# Patient Record
Sex: Female | Born: 1975 | Race: White | Hispanic: No | Marital: Married | Smoking: Current every day smoker
Health system: Southern US, Community
[De-identification: ages and names within clinical notes are randomized; demographics above are authoritative.]

## PROBLEM LIST (undated history)

## (undated) ENCOUNTER — Ambulatory Visit: Admission: EM | Payer: MEDICAID | Source: Home / Self Care

## (undated) DIAGNOSIS — D682 Hereditary deficiency of other clotting factors: Secondary | ICD-10-CM

## (undated) DIAGNOSIS — I10 Essential (primary) hypertension: Secondary | ICD-10-CM

## (undated) HISTORY — PX: LEEP: SHX91

## (undated) HISTORY — PX: OTHER SURGICAL HISTORY: SHX169

---

## 2002-05-12 ENCOUNTER — Encounter: Payer: Self-pay | Admitting: Emergency Medicine

## 2002-05-12 ENCOUNTER — Emergency Department (HOSPITAL_COMMUNITY): Admission: EM | Admit: 2002-05-12 | Discharge: 2002-05-13 | Payer: Self-pay | Admitting: Emergency Medicine

## 2002-05-13 ENCOUNTER — Encounter: Payer: Self-pay | Admitting: Emergency Medicine

## 2004-07-17 ENCOUNTER — Emergency Department: Payer: Self-pay | Admitting: General Practice

## 2005-03-02 ENCOUNTER — Emergency Department: Payer: Self-pay | Admitting: Emergency Medicine

## 2005-03-03 ENCOUNTER — Ambulatory Visit: Payer: Self-pay | Admitting: Emergency Medicine

## 2005-09-30 ENCOUNTER — Emergency Department: Payer: Self-pay | Admitting: Emergency Medicine

## 2005-10-05 ENCOUNTER — Emergency Department: Payer: Self-pay | Admitting: Emergency Medicine

## 2006-03-08 ENCOUNTER — Emergency Department: Payer: Self-pay | Admitting: Unknown Physician Specialty

## 2006-09-26 ENCOUNTER — Emergency Department: Payer: Self-pay | Admitting: Unknown Physician Specialty

## 2006-09-28 ENCOUNTER — Emergency Department: Payer: Self-pay | Admitting: Emergency Medicine

## 2006-12-02 ENCOUNTER — Ambulatory Visit: Payer: Self-pay | Admitting: Gastroenterology

## 2006-12-09 ENCOUNTER — Emergency Department: Payer: Self-pay | Admitting: Emergency Medicine

## 2007-08-28 ENCOUNTER — Emergency Department: Payer: Self-pay | Admitting: Emergency Medicine

## 2007-10-17 ENCOUNTER — Emergency Department: Payer: Self-pay

## 2008-04-01 ENCOUNTER — Emergency Department: Payer: Self-pay | Admitting: Emergency Medicine

## 2008-04-05 ENCOUNTER — Emergency Department: Payer: Self-pay | Admitting: Emergency Medicine

## 2011-01-01 ENCOUNTER — Emergency Department: Payer: Self-pay | Admitting: Emergency Medicine

## 2020-03-17 ENCOUNTER — Emergency Department: Payer: Self-pay

## 2020-03-17 ENCOUNTER — Other Ambulatory Visit: Payer: Self-pay

## 2020-03-17 ENCOUNTER — Observation Stay: Payer: Self-pay

## 2020-03-17 ENCOUNTER — Observation Stay
Admission: EM | Admit: 2020-03-17 | Discharge: 2020-03-18 | Disposition: A | Discharge status: Home / Self Care | Payer: Self-pay | Attending: Family Medicine | Admitting: Family Medicine

## 2020-03-17 DIAGNOSIS — R200 Anesthesia of skin: Secondary | ICD-10-CM

## 2020-03-17 DIAGNOSIS — I639 Cerebral infarction, unspecified: Secondary | ICD-10-CM

## 2020-03-17 DIAGNOSIS — J449 Chronic obstructive pulmonary disease, unspecified: Secondary | ICD-10-CM

## 2020-03-17 DIAGNOSIS — G459 Transient cerebral ischemic attack, unspecified: Principal | ICD-10-CM | POA: Insufficient documentation

## 2020-03-17 DIAGNOSIS — I10 Essential (primary) hypertension: Secondary | ICD-10-CM | POA: Insufficient documentation

## 2020-03-17 DIAGNOSIS — Z20822 Contact with and (suspected) exposure to covid-19: Secondary | ICD-10-CM | POA: Insufficient documentation

## 2020-03-17 DIAGNOSIS — F17200 Nicotine dependence, unspecified, uncomplicated: Secondary | ICD-10-CM | POA: Insufficient documentation

## 2020-03-17 DIAGNOSIS — R2990 Unspecified symptoms and signs involving the nervous system: Secondary | ICD-10-CM

## 2020-03-17 HISTORY — DX: Hereditary deficiency of other clotting factors: D68.2

## 2020-03-17 HISTORY — DX: Essential (primary) hypertension: I10

## 2020-03-17 LAB — DIFFERENTIAL
Abs Immature Granulocytes: 0.02 10*3/uL (ref 0.00–0.07)
Basophils Absolute: 0 10*3/uL (ref 0.0–0.1)
Basophils Relative: 1 %
Eosinophils Absolute: 0.3 10*3/uL (ref 0.0–0.5)
Eosinophils Relative: 5 %
Immature Granulocytes: 0 %
Lymphocytes Relative: 28 %
Lymphs Abs: 1.5 10*3/uL (ref 0.7–4.0)
Monocytes Absolute: 0.5 10*3/uL (ref 0.1–1.0)
Monocytes Relative: 9 %
Neutro Abs: 3 10*3/uL (ref 1.7–7.7)
Neutrophils Relative %: 57 %

## 2020-03-17 LAB — URINE DRUG SCREEN, QUALITATIVE (ARMC ONLY)
Amphetamines, Ur Screen: NOT DETECTED
Barbiturates, Ur Screen: NOT DETECTED
Benzodiazepine, Ur Scrn: NOT DETECTED
Cannabinoid 50 Ng, Ur ~~LOC~~: NOT DETECTED
Cocaine Metabolite,Ur ~~LOC~~: NOT DETECTED
MDMA (Ecstasy)Ur Screen: NOT DETECTED
Methadone Scn, Ur: NOT DETECTED
Opiate, Ur Screen: NOT DETECTED
Phencyclidine (PCP) Ur S: NOT DETECTED
Tricyclic, Ur Screen: NOT DETECTED

## 2020-03-17 LAB — COMPREHENSIVE METABOLIC PANEL
ALT: 11 U/L (ref 0–44)
AST: 12 U/L — ABNORMAL LOW (ref 15–41)
Albumin: 4.2 g/dL (ref 3.5–5.0)
Alkaline Phosphatase: 50 U/L (ref 38–126)
Anion gap: 10 (ref 5–15)
BUN: 13 mg/dL (ref 6–20)
CO2: 25 mmol/L (ref 22–32)
Calcium: 8.7 mg/dL — ABNORMAL LOW (ref 8.9–10.3)
Chloride: 103 mmol/L (ref 98–111)
Creatinine, Ser: 0.75 mg/dL (ref 0.44–1.00)
GFR, Estimated: >60 mL/min (ref >60)
Glucose, Bld: 116 mg/dL — ABNORMAL HIGH (ref 70–99)
Potassium: 3.8 mmol/L (ref 3.5–5.1)
Sodium: 138 mmol/L (ref 135–145)
Total Bilirubin: 0.4 mg/dL (ref 0.3–1.2)
Total Protein: 7.2 g/dL (ref 6.5–8.1)

## 2020-03-17 LAB — ETHANOL: Alcohol, Ethyl (B): <10 mg/dL (ref <10)

## 2020-03-17 LAB — CBC
HCT: 37.2 % (ref 36.0–46.0)
Hemoglobin: 12.4 g/dL (ref 12.0–15.0)
MCH: 29.9 pg (ref 26.0–34.0)
MCHC: 33.3 g/dL (ref 30.0–36.0)
MCV: 89.6 fL (ref 80.0–100.0)
Platelets: 219 10*3/uL (ref 150–400)
RBC: 4.15 MIL/uL (ref 3.87–5.11)
RDW: 12.6 % (ref 11.5–15.5)
WBC: 5.2 10*3/uL (ref 4.0–10.5)
nRBC: 0 % (ref 0.0–0.2)

## 2020-03-17 LAB — URINALYSIS, ROUTINE W REFLEX MICROSCOPIC
Bilirubin Urine: NEGATIVE
Glucose, UA: NEGATIVE mg/dL
Hgb urine dipstick: NEGATIVE
Ketones, ur: NEGATIVE mg/dL
Leukocytes,Ua: NEGATIVE
Nitrite: NEGATIVE
Protein, ur: NEGATIVE mg/dL
Specific Gravity, Urine: 1.005 (ref 1.005–1.030)
pH: 8 (ref 5.0–8.0)

## 2020-03-17 LAB — PROTIME-INR
INR: 0.9 (ref 0.8–1.2)
Prothrombin Time: 12 seconds (ref 11.4–15.2)

## 2020-03-17 LAB — RESP PANEL BY RT-PCR (FLU A&B, COVID) ARPGX2
Influenza A by PCR: NEGATIVE
Influenza B by PCR: NEGATIVE
SARS Coronavirus 2 by RT PCR: NEGATIVE

## 2020-03-17 LAB — APTT: aPTT: 28 seconds (ref 24–36)

## 2020-03-17 LAB — POC URINE PREG, ED: Preg Test, Ur: NEGATIVE

## 2020-03-17 LAB — CBG MONITORING, ED: Glucose-Capillary: 123 mg/dL — ABNORMAL HIGH (ref 70–99)

## 2020-03-17 MED ORDER — IOHEXOL 350 MG/ML SOLN
75.0000 mL | Freq: Once | INTRAVENOUS | Status: AC | PRN
  Administered 2020-03-17: 75 mL via INTRAVENOUS

## 2020-03-17 MED ORDER — ACETAMINOPHEN 325 MG PO TABS
650.0000 mg | ORAL_TABLET | ORAL | Status: DC | PRN
  Administered 2020-03-17 – 2020-03-18 (×2): 650 mg via ORAL
  Filled 2020-03-17 (×2): qty 2

## 2020-03-17 MED ORDER — ASPIRIN 81 MG PO CHEW
81.0000 mg | CHEWABLE_TABLET | Freq: Every day | ORAL | Status: DC
  Administered 2020-03-17 – 2020-03-18 (×2): 81 mg via ORAL
  Filled 2020-03-17 (×2): qty 1

## 2020-03-17 MED ORDER — ATORVASTATIN CALCIUM 20 MG PO TABS
40.0000 mg | ORAL_TABLET | Freq: Every day | ORAL | Status: DC
  Administered 2020-03-17 – 2020-03-18 (×2): 40 mg via ORAL
  Filled 2020-03-17 (×2): qty 2

## 2020-03-17 MED ORDER — STROKE: EARLY STAGES OF RECOVERY BOOK: Freq: Once | Status: DC

## 2020-03-17 MED ORDER — ENOXAPARIN SODIUM 40 MG/0.4ML ~~LOC~~ SOLN
40.0000 mg | SUBCUTANEOUS | Status: DC
  Administered 2020-03-17: 40 mg via SUBCUTANEOUS
  Filled 2020-03-17: qty 0.4

## 2020-03-17 MED ORDER — ACETAMINOPHEN 160 MG/5ML PO SOLN
650.0000 mg | ORAL | Status: DC | PRN
  Filled 2020-03-17: qty 20.3

## 2020-03-17 MED ORDER — ENOXAPARIN SODIUM 60 MG/0.6ML ~~LOC~~ SOLN
0.5000 mg/kg | SUBCUTANEOUS | Status: DC
  Filled 2020-03-17: qty 0.6

## 2020-03-17 MED ORDER — ACETAMINOPHEN 650 MG RE SUPP: 650.0000 mg | RECTAL | Status: DC | PRN

## 2020-03-17 MED ORDER — ALBUTEROL SULFATE HFA 108 (90 BASE) MCG/ACT IN AERS
2.0000 | INHALATION_SPRAY | RESPIRATORY_TRACT | Status: DC | PRN
  Filled 2020-03-17: qty 6.7

## 2020-03-17 NOTE — ED Provider Notes (Signed)
[facility] Emergency Department Provider Note  ____________________________________________   First MD Initiated Contact with Patient 03/17/20 1106     (approximate)  I have reviewed the triage vital signs and the nursing notes.   HISTORY  Chief Complaint Numbness    HPI Cindy Manning is a 44 y.o. female  With ho HTN here with R sided numbness, weakness. Pt states that approx 20 min ago, she was brushing her teeth when she began to feel R leg and R arm numbness. She also felt like her R face and teeth were numb. She then noticed she has been having some difficulty keeping her arm up to brush her teeth, and her legs "buckled" when walking. No vision changes. No l sided changes. Sx have persisted since then but slightly improved. No ongoing weakness. No headache. She does admit to significant increased stress recently, as her son is in the ICU.         Past Medical History:  Diagnosis Date  . Hypertension     There are no problems to display for this patient.   History reviewed. No pertinent surgical history.  Prior to Admission medications   Not on File    Allergies Patient has no known allergies.  History reviewed. No pertinent family history.  Social History Social History   Tobacco Use  . Smoking status: Current Every Day Smoker  . Smokeless tobacco: Never Used  Substance Use Topics  . Alcohol use: Not on file  . Drug use: Not on file    Review of Systems  Review of Systems  Constitutional: Negative for chills and fever.  HENT: Negative for sore throat.   Respiratory: Negative for shortness of breath.   Cardiovascular: Negative for chest pain.  Gastrointestinal: Negative for abdominal pain.  Genitourinary: Negative for flank pain.  Musculoskeletal: Negative for neck pain.  Skin: Negative for rash and wound.  Allergic/Immunologic: Negative for immunocompromised state.  Neurological: Positive for weakness and numbness.   Hematological: Does not bruise/bleed easily.     ____________________________________________  PHYSICAL EXAM:      VITAL SIGNS: ED Triage Vitals [03/17/20 1108]  Enc Vitals Group     BP (!) 264/139     Pulse Rate (!) 109     Resp 14     Temp 97.7 F (36.5 C)     Temp Source Oral     SpO2 100 %     Weight      Height      Head Circumference      Peak Flow      Pain Score 7     Pain Loc      Pain Edu?      Excl. in GC?      Physical Exam Vitals and nursing note reviewed.  Constitutional:      General: She is not in acute distress.    Appearance: She is well-developed.  HENT:     Head: Normocephalic and atraumatic.  Eyes:     Conjunctiva/sclera: Conjunctivae normal.  Cardiovascular:     Rate and Rhythm: Normal rate and regular rhythm.     Heart sounds: Normal heart sounds.  Pulmonary:     Effort: Pulmonary effort is normal. No respiratory distress.     Breath sounds: No wheezing.  Abdominal:     General: There is no distension.  Musculoskeletal:     Cervical back: Neck supple.  Skin:    General: Skin is warm.  Capillary Refill: Capillary refill takes less than 2 seconds.     Findings: No rash.  Neurological:     Mental Status: She is alert and oriented to person, place, and time.     Motor: No abnormal muscle tone.      Neurological Exam:  Mental Status: Alert and oriented to person, place, and time. Attention and concentration normal. Speech clear. Recent memory is intact. Cranial Nerves: Visual fields grossly intact. EOMI and PERRLA. No nystagmus noted. Facial sensation intact at forehead, maxillary cheek, and chin/mandible on left, diminished on right. No facial asymmetry or weakness. Hearing grossly normal. Uvula is midline, and palate elevates symmetrically. Normal SCM and trapezius strength. Tongue midline without fasciculations. Motor: Muscle strength 5/5 in proximal and distal UE and LE bilaterally. No pronator drift. Muscle tone normal.   Sensation: subjectively diminished R face, RUE and RLE Gait: Deferred Coordination: Normal FTN bilaterally.    ____________________________________________   LABS (all labs ordered are listed, but only abnormal results are displayed)  Labs Reviewed  CBG MONITORING, ED - Abnormal; Notable for the following components:      Result Value   Glucose-Capillary 123 (*)    All other components within normal limits  ETHANOL  PROTIME-INR  APTT  CBC  DIFFERENTIAL  COMPREHENSIVE METABOLIC PANEL  URINE DRUG SCREEN, QUALITATIVE ([facility] ONLY)  URINALYSIS, ROUTINE W REFLEX MICROSCOPIC  POC URINE PREG, ED  I-STAT CREATININE, ED    ____________________________________________  EKG: Normal sinus rhythm, ventricular rate 95.  QRS 96, QTc 473.  No acute ST elevations or depression but no EKG evidence of acute ischemia or infarct. ________________________________________  RADIOLOGY All imaging, including plain films, CT scans, and ultrasounds, independently reviewed by me, and interpretations confirmed via formal radiology reads.  ED MD interpretation:   CT Head: NAICA  Official radiology report(s): CT HEAD CODE STROKE WO CONTRAST  Addendum Date: 03/17/2020   ADDENDUM REPORT: 03/17/2020 11:59 ADDENDUM: Code stroke imaging results were communicated on 03/17/2020 at 11:50 am to provider Dr. Erma Heritage Via telephone, who verbally acknowledged these results. Electronically Signed   By: Stana Bunting M.D.   On: 03/17/2020 11:59   Result Date: 03/17/2020 CLINICAL DATA:  Code stroke.  Neuro deficit, acute, stroke suspected EXAM: CT HEAD WITHOUT CONTRAST TECHNIQUE: Contiguous axial images were obtained from the base of the skull through the vertex without intravenous contrast. COMPARISON:  None. FINDINGS: Brain: No acute infarct or intracranial hemorrhage. No mass lesion. No midline shift, ventriculomegaly or extra-axial fluid collection. Vascular: No hyperdense vessel or unexpected calcification.  Skull: Negative for fracture or focal lesion. Sinuses/Orbits: No acute finding. Clear paranasal sinuses and mastoid air cells. Other: None. ASPECTS [facility] Stroke Program Early CT Score) - Ganglionic level infarction (caudate, lentiform nuclei, internal capsule, insula, M1-M3 cortex): 7 - Supraganglionic infarction (M4-M6 cortex): 3 Total score (0-10 with 10 being normal): 10 IMPRESSION: 1. No acute intracranial process. If suspicion persists consider MRI brain for further evaluation. 2. ASPECTS is 10. Electronically Signed: By: Stana Bunting M.D. On: 03/17/2020 11:44    ____________________________________________  PROCEDURES   Procedure(s) performed (including Critical Care):  Procedures  ____________________________________________  INITIAL IMPRESSION / MDM / ASSESSMENT AND PLAN / ED COURSE  As part of my medical decision making, I reviewed the following data within the electronic MEDICAL RECORD NUMBER Nursing notes reviewed and incorporated, Old chart reviewed, Notes from prior ED visits, and [place] Controlled Substance Database       *Cindy Manning was evaluated in Emergency Department on 03/17/2020  for the symptoms described in the history of present illness. She was evaluated in the context of the global COVID-19 pandemic, which necessitated consideration that the patient might be at risk for infection with the SARS-CoV-2 virus that causes COVID-19. Institutional protocols and algorithms that pertain to the evaluation of patients at risk for COVID-19 are in a state of rapid change based on information released by regulatory bodies including the CDC and federal and state organizations. These policies and algorithms were followed during the patient's care in the ED.  Some ED evaluations and interventions may be delayed as a result of limited staffing during the pandemic.*     Medical Decision Making:  44 yo F here with acute onset R-sided numbness, possible weakness vs loss of  coordination. Suspect lacunar infarct. LKN 30 min PTA so code stroke activated, and Dr. Loretha Brasil present at bedside for evaluation. CT head reviewed, shows NAICA. Suspect lacunar infarct. NIHSS <2. Do not feel benefits of tPA outweigh risks, and her deficits seem to be resolving. Will admit for stroke w/u. Will allow permissive HTN for now. Pt updated and in agreement. Maintained on tele in ED which was reviewed, shows NSR without AFib or other arrhythmia.  ____________________________________________  FINAL CLINICAL IMPRESSION(S) / ED DIAGNOSES  Final diagnoses:  Stroke-like episode     MEDICATIONS GIVEN DURING THIS VISIT:  Medications  iohexol (OMNIPAQUE) 350 MG/ML injection 75 mL (75 mLs Intravenous Contrast Given 03/17/20 1144)     ED Discharge Orders    None       Note:  This document was prepared using Dragon voice recognition software and may include unintentional dictation errors.   Shaune Pollack, MD 03/17/20 [phone]

## 2020-03-17 NOTE — ED Triage Notes (Signed)
Pt presents via POV c/o right sided numbness x20 mins. Denies aphasia, dysphagia, and weakness. Pt reports under a lot of stress currently. A&Ox4.

## 2020-03-17 NOTE — ED Notes (Signed)
Patient from MRI to US

## 2020-03-17 NOTE — ED Notes (Signed)
Neurologist at CT bedside.

## 2020-03-17 NOTE — H&P (Signed)
History and Physical  CAMBRYN CHARTERS ZOX:[id number] DOB: Aug 11, 1975 DOA: 03/17/2020  PCP: Patient, No Pcp Per   Chief Complaint: right side numbness  HPI:  44 year old woman PMH essential hypertension untreated, not on any medications, COPD, untreated, presented to the emergency department after developing sudden right-sided upper and lower extremity and facial numbness.  Admitted for TIA versus CVA evaluation.  Patient was folding laundry today when at about 11 AM she noticed that her right leg was numb.  She thought this might have been positional and went to brush her teeth, however had difficulty brushing her teeth with her right hand secondary to numbness as well.  Had difficulty walking which she thinks was sensation related with some knee buckling.  Also noted right-sided facial numbness and numbness in her mouth.  No specific aggravating or alleviating factors.  The numbness in her extremities appears to slightly improved only to progress.  No motor deficits noted.  No difficulty speaking or swallowing.  No previous history of similar symptoms.  Reports longstanding hypertension with baseline blood pressure around 180/110, untreated.  In the past she was on hydrochlorothiazide and lisinopril but reports that her blood pressure ran high despite this.  Under a lot of stress, her son is currently hospitalized.  ED Course: Seen by neurology with recommendation for stroke evaluation.  Review of Systems:  Negative for fever, visual changes, sore throat, rash, new muscle aches, chest pain, shortness of breath, dysuria, bleeding, nausea, vomiting, abdominal pain.  PMH . Essential hypertension . COPD . 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 Remainder reviewed in Epic  PSH . C-section . Knee arthroscopy . Remainder reviewed in Epic  Family history includes: . Sister with factor V Leiden deficiency and factor VII deficiency . Remainder reviewed in Epic  Social  History . Smokes 1.5 packs/day . No alcohol . Quit narcotics more than 3 days ago  Allergies . Biaxin caused a rash . Latex caused skin irritation . Remainder reviewed in Epic  Meds include: . None   Physicial Exam   Vitals:  Vitals:   03/17/20 1230 03/17/20 1315  BP: (!) 184/110 (!) 127/115  Pulse: 85 83  Resp: 14 18  Temp:    SpO2: 98% 97%   Constitutional:   . Appears calm and comfortable in the emergency department Eyes:  . pupils and irises appear normal . Normal lids  ENMT:  . grossly normal hearing  . Lips appear normal . nose appear normal . Oropharynx: mucosa, tongue appear normal.  The left side of the soft palate may be slightly depressed. Neck:  . neck appears normal, no masses . no thyromegaly Respiratory:  . CTA bilaterally, no w/r/r.  . Respiratory effort normal.  Cardiovascular:  . RRR, no m/r/g . No LE extremity edema   .  Abdomen:  . Abdomen appears normal; no tenderness or masses . No hernias Musculoskeletal:  . RUE, LUE, RLE, LLE   o Tone appears grossly normal all extremities.  Perhaps slightly less strength in the right upper and right lower extremities, 4+/5 although may be intact. o No tenderness, masses Skin:  . No rashes, lesions, ulcers . palpation of skin: no induration or nodules Neurologic:  . CN 2-12 intact . Sensation all 4 extremities grossly intact.  Reports diminished sensation on the right side. Psychiatric:  . Mental status o Mood, affect appropriate . judgment and insight appear intact   I have personally reviewed following labs and imaging studies  Labs:  Marland Kitchen Complete  metabolic panel unremarkable . CBC unremarkable . Urine pregnancy test negative . Urine drug screen negative  Imaging studies:   CT head neck no acute abnormalities.  No large vessel occlusion.  Medical tests:   EKG independently reviewed: Sinus rhythm, no acute changes  ASSESSMENT/PLAN   Right upper and right lower extremity, right  facial numbness, possibly some RUE/RLE weakness (mild), suspicious for CVA versus TIA.  Not a candidate for TPA as per neurology. --CTA head and neck unrevealing --Appreciate neurology consultation.  We will plan stroke evaluation to include MRI brain, echocardiogram and therapy evaluations. --Aspirin 81 mg daily.  Start statin.  Fasting lipid panel in the morning. --Permissive hypertension as below  Essential hypertension uncontrolled --Known diagnosis but patient has been off medications for some time --Reports home BP typically runs 180/110, blood pressure here running about the same.  Treat with permissive hypertension.  As blood pressures at baseline, will not aggressively reduce.  Will treat for greater than 200/120 but will monitor closely.  COPD --Appears stable.  Albuterol nebulizer as needed.  Covid screening protocol pending.  No signs or symptoms to suggest infection.  DVT prophylaxis: enoxaparin Code Status: Full Family Communication: husband at bedside Consults called: neurology by EDP    Time spent: 50 minutes  Brendia Sacks, MD  [facility] Direct contact: see [email or web address]  7PM-7AM contact night coverage as below   1. Check the care team in [facility] and look for a) attending/consulting [facility] provider listed and b) the [facility] team listed 2. Log into [email or web address] and use [place]'s universal password to access. If you do not have the password, please contact the hospital operator. 3. Locate the [facility] provider you are looking for under [facility] and page to a number that you can be directly reached. 4. If you still have difficulty reaching the provider, please page the [facility] (Director on Call) for the Hospitalists listed on amion for assistance.  Severity of Illness: The appropriate patient status for this patient is OBSERVATION. Observation status is judged to be reasonable and necessary in order to provide the required intensity of service to ensure the  patient's safety. The patient's presenting symptoms, physical exam findings, and initial radiographic and laboratory data in the context of their medical condition is felt to place them at decreased risk for further clinical deterioration. Furthermore, it is anticipated that the patient will be medically stable for discharge from the hospital within 2 midnights of admission. The following factors support the patient status of observation.   " The patient's presenting symptoms include right facial, arm and leg numbness. " The physical exam findings include possibly mild right-sided upper and lower extremity weakness, hypertension SBP 180s, diastolic blood pressure 110s. " The initial radiographic and laboratory data are reassuring.  Status is: Observation  The patient remains OBS appropriate and will d/c before 2 midnights.  Dispo: The patient is from: Home              Anticipated d/c is to: Home              Anticipated d/c date is: 1 day              Patient currently is not medically stable to d/c.   03/17/2020, 1:59 PM   Principal Problem:   TIA (transient ischemic attack) Active Problems:   Right leg numbness   Right arm numbness   Benign essential HTN   COPD (chronic obstructive pulmonary disease) (HCC)

## 2020-03-17 NOTE — ED Notes (Signed)
Patient back from scans.

## 2020-03-17 NOTE — Consult Note (Signed)
Reason for Consult: R sided numbness  Requesting Physician: Dr. Erma Heritage  CC: R sided numbness    HPI: Cindy Manning is an 44 y.o. female with hx of uncontrolled HTN, not other past medical problems per patient on no medications. Last known well around 9 AM  After she woke up and started to fold laundry. Started feeling numbness RLE progressed to whole R side.  NIHSS of 1 on exam.   Past Medical History:  Diagnosis Date  . Hypertension     History reviewed. No pertinent surgical history.  History reviewed. No pertinent family history.  Social History:  reports that she has been smoking. She has never used smokeless tobacco. No history on file for alcohol use and drug use.  No Known Allergies  Medications: I have reviewed the patient's current medications.  ROS: History obtained from the patient  General ROS: negative for - chills, fatigue, fever, night sweats, weight gain or weight loss Psychological ROS: negative for - behavioral disorder, hallucinations, memory difficulties, mood swings or suicidal ideation Ophthalmic ROS: negative for - blurry vision, double vision, eye pain or loss of vision ENT ROS: negative for - epistaxis, nasal discharge, oral lesions, sore throat, tinnitus or vertigo Allergy and Immunology ROS: negative for - hives or itchy/watery eyes Hematological and Lymphatic ROS: negative for - bleeding problems, bruising or swollen lymph nodes Endocrine ROS: negative for - galactorrhea, hair pattern changes, polydipsia/polyuria or temperature intolerance Respiratory ROS: negative for - cough, hemoptysis, shortness of breath or wheezing Cardiovascular ROS: negative for - chest pain, dyspnea on exertion, edema or irregular heartbeat Gastrointestinal ROS: negative for - abdominal pain, diarrhea, hematemesis, nausea/vomiting or stool incontinence Genito-Urinary ROS: negative for - dysuria, hematuria, incontinence or urinary frequency/urgency Musculoskeletal ROS:  negative for - joint swelling or muscular weakness Neurological ROS: as noted in HPI Dermatological ROS: negative for rash and skin lesion changes  Physical Examination: Blood pressure (!) 264/139, pulse (!) 109, temperature 97.7 F (36.5 C), temperature source Oral, resp. rate 14, last menstrual period 02/10/2020, SpO2 100 %.   Neurological Examination   Mental Status: Alert, oriented, thought content appropriate.  Speech fluent without evidence of aphasia.  Able to follow 3 step commands without difficulty. Cranial Nerves: II: Discs flat bilaterally; Visual fields grossly normal, pupils equal, round, reactive to light and accommodation III,IV, VI: ptosis not present, extra-ocular motions intact bilaterally V,VII: smile symmetric, facial light touch sensation normal bilaterally VIII: hearing normal bilaterally IX,X: gag reflex present XI: bilateral shoulder shrug XII: midline tongue extension Motor: Right : Upper extremity   5/5    Left:     Upper extremity   5/5  Lower extremity   5/5     Lower extremity   5/5 Tone and bulk:normal tone throughout; no atrophy noted Sensory: subjective Numbness R side Deep Tendon Reflexes: 2+ and symmetric throughout     Laboratory Studies:   Basic Metabolic Panel: No results for input(s): NA, K, CL, CO2, GLUCOSE, BUN, CREATININE, CALCIUM, MG, PHOS in the last 168 hours.  Liver Function Tests: No results for input(s): AST, ALT, ALKPHOS, BILITOT, PROT, ALBUMIN in the last 168 hours. No results for input(s): LIPASE, AMYLASE in the last 168 hours. No results for input(s): AMMONIA in the last 168 hours.  CBC: No results for input(s): WBC, NEUTROABS, HGB, HCT, MCV, PLT in the last 168 hours.  Cardiac Enzymes: No results for input(s): CKTOTAL, CKMB, CKMBINDEX, TROPONINI in the last 168 hours.  BNP: Invalid input(s): POCBNP  CBG: Recent Labs  Lab 03/17/20 1114  GLUCAP 123*    Microbiology: No results found for this or any previous  visit.  Coagulation Studies: No results for input(s): LABPROT, INR in the last 72 hours.  Urinalysis: No results for input(s): COLORURINE, LABSPEC, PHURINE, GLUCOSEU, HGBUR, BILIRUBINUR, KETONESUR, PROTEINUR, UROBILINOGEN, NITRITE, LEUKOCYTESUR in the last 168 hours.  Invalid input(s): APPERANCEUR  Lipid Panel:  No results found for: CHOL, TRIG, HDL, CHOLHDL, VLDL, LDLCALC  HgbA1C: No results found for: HGBA1C  Urine Drug Screen:  No results found for: LABOPIA, COCAINSCRNUR, LABBENZ, AMPHETMU, THCU, LABBARB  Alcohol Level: No results for input(s): ETH in the last 168 hours.  Other results: EKG: normal EKG, normal sinus rhythm, unchanged from previous tracings.  Imaging: No results found.   Assessment/Plan:  44 y.o. female with hx of uncontrolled HTN, not other past medical problems per patient on no medications. Last known well around 9 AM  After she woke up and started to fold laundry. Started feeling numbness RLE progressed to whole R side.  NIHSS of 1 on exam.   - HTN is a risk factor for stroke - CTH no acute abnormalities - Not tpa candidate as low NIHSS of 1 for sensory deficits - CTA h/n ordered - ASA 81mg /statin to be started - pt/ot/speech - 2decho - MRI brain.  d/c planning tomorrow with better BP control. Would allow permissive HTN overnight and not treat unless >180 sustained.  03/17/2020, 11:44 AM

## 2020-03-17 NOTE — ED Notes (Signed)
Patient to CT.

## 2020-03-17 NOTE — ED Notes (Signed)
Patient at x-ray/MRI.

## 2020-03-17 NOTE — ED Notes (Signed)
Back back from CT at this time.

## 2020-03-17 NOTE — ED Notes (Signed)
CODE  STROKE  CALLED  TO  [phone]  PER  DR  ISSACS MD

## 2020-03-17 NOTE — Progress Notes (Signed)
PHARMACIST - PHYSICIAN COMMUNICATION  CONCERNING:  Enoxaparin (Lovenox) for DVT Prophylaxis    RECOMMENDATION: Patient was prescribed enoxaprin 40mg  q24 hours for VTE prophylaxis.   Filed Weights   03/17/20 1358  Weight: 86.2 kg (190 lb)    Body mass index is 33.66 kg/m.  Estimated Creatinine Clearance: 93.4 mL/min (by C-G formula based on SCr of 0.75 mg/dL).   Based on [facility] policy patient is candidate for enoxaparin 0.5mg /kg TBW SQ every 24 hours based on BMI being >30.  DESCRIPTION: Pharmacy has adjusted enoxaparin dose per [facility] policy.  Patient is now receiving enoxaparin 42.5 mg every 24 hours    [facility], PharmD Clinical Pharmacist  03/17/2020 2:53 PM

## 2020-03-17 NOTE — ED Notes (Signed)
Attempted to give report. Nurse to call back.

## 2020-03-18 ENCOUNTER — Observation Stay
Admit: 2020-03-18 | Discharge: 2020-03-18 | Disposition: A | Discharge status: Home / Self Care | Payer: Self-pay | Attending: Family Medicine | Admitting: Family Medicine

## 2020-03-18 LAB — ECHOCARDIOGRAM COMPLETE
AR max vel: 2.1 cm2
AV Area VTI: 2.21 cm2
AV Area mean vel: 2.06 cm2
AV Mean grad: 2.5 mmHg
AV Peak grad: 4.8 mmHg
Ao pk vel: 1.1 m/s
Area-P 1/2: 9.85 cm2
Height: 63 in
Weight: 3139.35 oz

## 2020-03-18 LAB — LIPID PANEL
Cholesterol: 194 mg/dL (ref 0–200)
HDL: 49 mg/dL (ref >40)
LDL Cholesterol: 123 mg/dL — ABNORMAL HIGH (ref 0–99)
Total CHOL/HDL Ratio: 4 RATIO
Triglycerides: 110 mg/dL (ref <150)
VLDL: 22 mg/dL (ref 0–40)

## 2020-03-18 LAB — HEMOGLOBIN A1C
Hgb A1c MFr Bld: 5.2 % (ref 4.8–5.6)
Mean Plasma Glucose: 102.54 mg/dL

## 2020-03-18 LAB — HIV ANTIBODY (ROUTINE TESTING W REFLEX): HIV Screen 4th Generation wRfx: NONREACTIVE

## 2020-03-18 MED ORDER — HYDROCHLOROTHIAZIDE 25 MG PO TABS: 25.0000 mg | ORAL_TABLET | Freq: Every day | ORAL | 3 refills | Status: AC

## 2020-03-18 MED ORDER — LISINOPRIL 20 MG PO TABS: 20.0000 mg | ORAL_TABLET | Freq: Every day | ORAL | 3 refills | Status: AC

## 2020-03-18 MED ORDER — BUPRENORPHINE HCL-NALOXONE HCL 8-2 MG SL SUBL
1.0000 | SUBLINGUAL_TABLET | Freq: Every day | SUBLINGUAL | Status: DC
  Administered 2020-03-18: 1 via SUBLINGUAL
  Filled 2020-03-18: qty 1

## 2020-03-18 MED ORDER — ATORVASTATIN CALCIUM 40 MG PO TABS: 40.0000 mg | ORAL_TABLET | Freq: Every day | ORAL | 11 refills | Status: AC

## 2020-03-18 MED ORDER — ASPIRIN 81 MG PO CHEW: 81.0000 mg | CHEWABLE_TABLET | Freq: Every day | ORAL | 11 refills | Status: AC

## 2020-03-18 MED ORDER — SPHYGMOMANOMETER MISC: 1.0000 | Freq: Every day | 0 refills | Status: AC

## 2020-03-18 MED ORDER — AMLODIPINE BESYLATE 5 MG PO TABS: 5.0000 mg | ORAL_TABLET | Freq: Every day | ORAL | 3 refills | Status: AC

## 2020-03-18 NOTE — Progress Notes (Signed)
Subjective: Still persistent numbness on R side.    HPI: Cindy Manning is an 44 y.o. female with hx of uncontrolled HTN, not other past medical problems per patient on no medications. Last known well around 9 AM  After she woke up and started to fold laundry. Started feeling numbness RLE progressed to whole R side.  NIHSS of 1 on exam.   Past Medical History:  Diagnosis Date  . Factor V deficiency (HCC)   . Factor VII deficiency (HCC)   . Hypertension     Past Surgical History:  Procedure Laterality Date  . arthroscopic knee    . CESAREAN SECTION    . LEEP      Family History  Problem Relation Age of Onset  . Factor V Leiden deficiency Sister     Social History:  reports that she has been smoking cigarettes. She has been smoking about 1.50 packs per day. She has never used smokeless tobacco. She reports previous alcohol use. She reports previous drug use.  Allergies  Allergen Reactions  . Baclofen Hives  . Gabapentin Other (See Comments)    Suicidal and depression  . Latex Other (See Comments)    Skin peels  . Pregabalin Other (See Comments)    Suicidal and depression  . Biaxin [Clarithromycin] Rash  . Oxycodone Itching    Low blood pressure    Medications: I have reviewed the patient's current medications.  ROS: History obtained from the patient  General ROS: negative for - chills, fatigue, fever, night sweats, weight gain or weight loss Psychological ROS: negative for - behavioral disorder, hallucinations, memory difficulties, mood swings or suicidal ideation Ophthalmic ROS: negative for - blurry vision, double vision, eye pain or loss of vision ENT ROS: negative for - epistaxis, nasal discharge, oral lesions, sore throat, tinnitus or vertigo Allergy and Immunology ROS: negative for - hives or itchy/watery eyes Hematological and Lymphatic ROS: negative for - bleeding problems, bruising or swollen lymph nodes Endocrine ROS: negative for - galactorrhea, hair  pattern changes, polydipsia/polyuria or temperature intolerance Respiratory ROS: negative for - cough, hemoptysis, shortness of breath or wheezing Cardiovascular ROS: negative for - chest pain, dyspnea on exertion, edema or irregular heartbeat Gastrointestinal ROS: negative for - abdominal pain, diarrhea, hematemesis, nausea/vomiting or stool incontinence Genito-Urinary ROS: negative for - dysuria, hematuria, incontinence or urinary frequency/urgency Musculoskeletal ROS: negative for - joint swelling or muscular weakness Neurological ROS: as noted in HPI Dermatological ROS: negative for rash and skin lesion changes  Physical Examination: Blood pressure (!) 166/116, pulse 79, temperature (!) 97.4 F (36.3 C), temperature source Oral, resp. rate 19, height 5\' 3"  (1.6 m), weight 89 kg, last menstrual period 02/10/2020, SpO2 97 %.   Neurological Examination   Mental Status: Alert, oriented, thought content appropriate.  Speech fluent without evidence of aphasia.  Able to follow 3 step commands without difficulty. Cranial Nerves: II: Discs flat bilaterally; Visual fields grossly normal, pupils equal, round, reactive to light and accommodation III,IV, VI: ptosis not present, extra-ocular motions intact bilaterally V,VII: smile symmetric, facial light touch sensation normal bilaterally VIII: hearing normal bilaterally IX,X: gag reflex present XI: bilateral shoulder shrug XII: midline tongue extension Motor: Right : Upper extremity   5/5    Left:     Upper extremity   5/5  Lower extremity   5/5     Lower extremity   5/5 Tone and bulk:normal tone throughout; no atrophy noted Sensory: subjective Numbness R side Deep Tendon Reflexes: 2+ and symmetric throughout  Laboratory Studies:   Basic Metabolic Panel: Recent Labs  Lab 03/17/20 1156  NA 138  K 3.8  CL 103  CO2 25  GLUCOSE 116*  BUN 13  CREATININE 0.75  CALCIUM 8.7*    Liver Function Tests: Recent Labs  Lab  03/17/20 1156  AST 12*  ALT 11  ALKPHOS 50  BILITOT 0.4  PROT 7.2  ALBUMIN 4.2   No results for input(s): LIPASE, AMYLASE in the last 168 hours. No results for input(s): AMMONIA in the last 168 hours.  CBC: Recent Labs  Lab 03/17/20 1156  WBC 5.2  NEUTROABS 3.0  HGB 12.4  HCT 37.2  MCV 89.6  PLT 219    Cardiac Enzymes: No results for input(s): CKTOTAL, CKMB, CKMBINDEX, TROPONINI in the last 168 hours.  BNP: Invalid input(s): POCBNP  CBG: Recent Labs  Lab 03/17/20 1114  GLUCAP 123*    Microbiology: Results for orders placed or performed during the hospital encounter of 03/17/20  Resp Panel by RT-PCR (Flu A&B, Covid) Nasopharyngeal Swab     Status: None   Collection Time: 03/17/20  1:35 PM   Specimen: Nasopharyngeal Swab; Nasopharyngeal(NP) swabs in vial transport medium  Result Value Ref Range Status   SARS Coronavirus 2 by RT PCR NEGATIVE NEGATIVE Final    Comment: (NOTE) SARS-CoV-2 target nucleic acids are NOT DETECTED.  The SARS-CoV-2 RNA is generally detectable in upper respiratory specimens during the acute phase of infection. The lowest concentration of SARS-CoV-2 viral copies this assay can detect is 138 copies/mL. A negative result does not preclude SARS-Cov-2 infection and should not be used as the sole basis for treatment or other patient management decisions. A negative result may occur with  improper specimen collection/handling, submission of specimen other than nasopharyngeal swab, presence of viral mutation(s) within the areas targeted by this assay, and inadequate number of viral copies(<138 copies/mL). A negative result must be combined with clinical observations, patient history, and epidemiological information. The expected result is Negative.  Fact Sheet for Patients:  BloggerCourse.com  Fact Sheet for Healthcare Providers:  SeriousBroker.it  This test is no t yet approved or cleared  by the [place] FDA and  has been authorized for detection and/or diagnosis of SARS-CoV-2 by FDA under an Emergency Use Authorization (EUA). This EUA will remain  in effect (meaning this test can be used) for the duration of the COVID-19 declaration under Section 564(b)(1) of the Act, 21 U.S.C.section 360bbb-3(b)(1), unless the authorization is terminated  or revoked sooner.       Influenza A by PCR NEGATIVE NEGATIVE Final   Influenza B by PCR NEGATIVE NEGATIVE Final    Comment: (NOTE) The Xpert Xpress SARS-CoV-2/FLU/RSV plus assay is intended as an aid in the diagnosis of influenza from Nasopharyngeal swab specimens and should not be used as a sole basis for treatment. Nasal washings and aspirates are unacceptable for Xpert Xpress SARS-CoV-2/FLU/RSV testing.  Fact Sheet for Patients: BloggerCourse.com  Fact Sheet for Healthcare Providers: SeriousBroker.it  This test is not yet approved or cleared by the [place] FDA and has been authorized for detection and/or diagnosis of SARS-CoV-2 by FDA under an Emergency Use Authorization (EUA). This EUA will remain in effect (meaning this test can be used) for the duration of the COVID-19 declaration under Section 564(b)(1) of the Act, 21 U.S.C. section 360bbb-3(b)(1), unless the authorization is terminated or revoked.  Performed at [facility], [address], [place], [place] [address]     Coagulation Studies: Recent Labs  03/17/20 1156  LABPROT 12.0  INR 0.9    Urinalysis:  Recent Labs  Lab 03/17/20 1158  COLORURINE STRAW*  LABSPEC 1.005  PHURINE 8.0  GLUCOSEU NEGATIVE  HGBUR NEGATIVE  BILIRUBINUR NEGATIVE  KETONESUR NEGATIVE  PROTEINUR NEGATIVE  NITRITE NEGATIVE  LEUKOCYTESUR NEGATIVE    Lipid Panel:     Component Value Date/Time   CHOL 194 03/18/2020 0508   TRIG 110 03/18/2020 0508   HDL 49 03/18/2020 0508   CHOLHDL 4.0 03/18/2020  0508   VLDL 22 03/18/2020 0508   LDLCALC 123 (H) 03/18/2020 0508    HgbA1C: No results found for: HGBA1C  Urine Drug Screen:      Component Value Date/Time   LABOPIA NONE DETECTED 03/17/2020 1158   COCAINSCRNUR NONE DETECTED 03/17/2020 1158   LABBENZ NONE DETECTED 03/17/2020 1158   AMPHETMU NONE DETECTED 03/17/2020 1158   THCU NONE DETECTED 03/17/2020 1158   LABBARB NONE DETECTED 03/17/2020 1158    Alcohol Level:  Recent Labs  Lab 03/17/20 1156  ETH <10    Other results: EKG: normal EKG, normal sinus rhythm, unchanged from previous tracings.  Imaging: DG Chest 2 View  Result Date: 03/17/2020 CLINICAL DATA:  CVA EXAM: CHEST - 2 VIEW COMPARISON:  None. FINDINGS: The cardiomediastinal silhouette is normal in contour. No pleural effusion. No pneumothorax. No acute pleuroparenchymal abnormality. Visualized abdomen is unremarkable. No acute osseous abnormality noted. IMPRESSION: No acute cardiopulmonary abnormality. Electronically Signed   By: Meda KlinefelterStephanie  Peacock MD   On: 03/17/2020 14:58   MR BRAIN WO CONTRAST  Addendum Date: 03/18/2020   ADDENDUM REPORT: 03/18/2020 08:25 ADDENDUM: Correction, the abnormal diffusion signal is in the lateral LEFT thalamus, concordant with right side symptoms. CONCLUSION: Small acute infarction of the lateral LEFT thalamus. Electronically Signed   By: Odessa FlemingH  Hall M.D.   On: 03/18/2020 08:25   Result Date: 03/18/2020 CLINICAL DATA:  Right-sided numbness lasting 20 minutes. EXAM: MRI HEAD WITHOUT CONTRAST TECHNIQUE: Multiplanar, multiecho pulse sequences of the brain and surrounding structures were obtained without intravenous contrast. COMPARISON:  CT studies same day FINDINGS: Brain: Diffusion imaging shows acute infarction in the lateral right thalamus, about 7-8 mm in size. No other acute finding. The brainstem and cerebellum are otherwise normal. Cerebral hemispheres show foci of abnormal T2 and FLAIR signal within the white matter consistent with small  vessel change, markedly premature for age. No cortical or large vessel territory infarction. No mass lesion, hemorrhage, hydrocephalus or extra-axial collection. Vascular: Major vessels at the base of the brain show flow. Skull and upper cervical spine: Negative Sinuses/Orbits: Clear/normal Other: None IMPRESSION: 1. 7-8 mm acute infarction in the lateral right thalamus. 2. Markedly premature for age chronic small-vessel ischemic changes of the cerebral hemispheric white matter. Electronically Signed: By: Paulina FusiMark  Shogry M.D. On: 03/17/2020 15:52   US Carotid Bilateral (at [facility] and [facility] only)  Result Date: 03/18/2020 CLINICAL DATA:  Left thalamus infarct. EXAM: BILATERAL CAROTID DUPLEX ULTRASOUND TECHNIQUE: Wallace CullensGray scale imaging, color Doppler and duplex ultrasound were performed of bilateral carotid and vertebral arteries in the neck. COMPARISON:  Brain MRI 03/17/2020 and CTA neck 03/17/2020 FINDINGS: Criteria: Quantification of carotid stenosis is based on velocity parameters that correlate the residual internal carotid diameter with NASCET-based stenosis levels, using the diameter of the distal internal carotid lumen as the denominator for stenosis measurement. The following velocity measurements were obtained: RIGHT ICA: 98/43 cm/sec CCA: 62/22 cm/sec SYSTOLIC ICA/CCA RATIO:  1.6 ECA: 115 cm/sec LEFT ICA: 86/30 cm/sec CCA: 71/29 cm/sec SYSTOLIC ICA/CCA  RATIO:  1.2 ECA: 83 cm/sec RIGHT CAROTID ARTERY: Right carotid arteries are patent without significant plaque or stenosis. External carotid artery is patent with normal waveform. Normal waveforms and velocities in the internal carotid artery. RIGHT VERTEBRAL ARTERY: Antegrade flow and normal waveform in the right vertebral artery. LEFT CAROTID ARTERY: Left carotid arteries are patent without significant plaque or stenosis. External carotid artery is patent with normal waveform. Normal waveforms and velocities in the internal carotid artery. LEFT VERTEBRAL ARTERY:  Antegrade flow in the left vertebral artery. Waveforms not obtained. Other: Prominent right submandibular lymph node measures 1.0 cm in the short axis. Prominent left submandibular lymph node measures 1.2 cm in the short axis. IMPRESSION: 1. Bilateral carotid arteries are widely patent without significant plaque or stenosis. 2. Patent vertebral arteries. 3. Mildly prominent lymph nodes in the submandibular regions. Findings are similar to the recent CTA findings. Electronically Signed   By: Richarda Overlie M.D.   On: 03/18/2020 08:42   ECHOCARDIOGRAM COMPLETE  Result Date: 03/18/2020    ECHOCARDIOGRAM REPORT   Patient Name:   Cindy Manning Date of Exam: 03/18/2020 Medical Rec #:  [id number]      Height:       63.0 in Accession #:    [id number]     Weight:       196.2 lb Date of Birth:  1975/10/14      BSA:          1.918 m Patient Age:    44 years       BP:           161/102 mmHg Patient Gender: F              HR:           59 bpm. Exam Location:  [facility] Procedure: 2D Echo, Cardiac Doppler and Color Doppler Indications:     TIA 435.9  History:         Patient has no prior history of Echocardiogram examinations.                  Risk Factors:Hypertension.  Sonographer:     Cristela Blue RDCS (AE) Referring Phys:  [id number] Mel[address] Alar GOODRICH Diagnosing Phys: Harold Hedge MD IMPRESSIONS  1. Left ventricular ejection fraction, by estimation, is 50 to 55%. The left ventricle has low normal function. The left ventricle has no regional wall motion abnormalities. Left ventricular diastolic parameters were normal.  2. Right ventricular systolic function is normal. The right ventricular size is normal.  3. Right atrial size was mildly dilated.  4. The mitral valve was not well visualized. Trivial mitral valve regurgitation.  5. The aortic valve was not well visualized. Aortic valve regurgitation is trivial. FINDINGS  Left Ventricle: Left ventricular ejection fraction, by estimation, is 50 to 55%. The left ventricle has low normal  function. The left ventricle has no regional wall motion abnormalities. The left ventricular internal cavity size was normal in size. There is borderline left ventricular hypertrophy. Left ventricular diastolic parameters were normal. Right Ventricle: The right ventricular size is normal. No increase in right ventricular wall thickness. Right ventricular systolic function is normal. Left Atrium: Left atrial size was normal in size. Right Atrium: Right atrial size was mildly dilated. Pericardium: There is no evidence of pericardial effusion. Mitral Valve: The mitral valve was not well visualized. Trivial mitral valve regurgitation. Tricuspid Valve: The tricuspid valve is not well visualized. Tricuspid valve regurgitation is trivial. Aortic Valve: The aortic valve  was not well visualized. Aortic valve regurgitation is trivial. Aortic valve mean gradient measures 2.5 mmHg. Aortic valve peak gradient measures 4.8 mmHg. Aortic valve area, by VTI measures 2.21 cm. Pulmonic Valve: The pulmonic valve was not well visualized. Pulmonic valve regurgitation is not visualized. Aorta: The aortic root was not well visualized. IAS/Shunts: The atrial septum is grossly normal.  LEFT VENTRICLE PLAX 2D LV IVS:        0.63 cm  Diastology LVOT diam:     2.20 cm  LV e' medial:    5.11 cm/s LV SV:         46       LV E/e' medial:  14.1 LV SV Index:   24       LV e' lateral:   4.68 cm/s LVOT Area:     3.80 cm LV E/e' lateral: 15.4  RIGHT VENTRICLE RV Basal diam:  2.41 cm RV S prime:     12.80 cm/s TAPSE (M-mode): 3.4 cm LEFT ATRIUM             Index       RIGHT ATRIUM           Index LA Vol (A2C):   57.9 ml 30.19 ml/m RA Area:     18.30 cm LA Vol (A4C):   51.7 ml 26.96 ml/m RA Volume:   50.30 ml  26.23 ml/m LA Biplane Vol: 59.6 ml 31.07 ml/m  AORTIC VALVE                   PULMONIC VALVE AV Area (Vmax):    2.10 cm    PV Vmax:        0.66 m/s AV Area (Vmean):   2.06 cm    PV Peak grad:   1.8 mmHg AV Area (VTI):     2.21 cm    RVOT  Peak grad: 1 mmHg AV Vmax:           109.90 cm/s AV Vmean:          67.650 cm/s AV VTI:            0.207 m AV Peak Grad:      4.8 mmHg AV Mean Grad:      2.5 mmHg LVOT Vmax:         60.60 cm/s LVOT Vmean:        36.600 cm/s LVOT VTI:          0.120 m LVOT/AV VTI ratio: 0.58 MITRAL VALVE               TRICUSPID VALVE MV Area (PHT): 9.85 cm    TR Peak grad:   11.3 mmHg MV Decel Time: 77 msec     TR Vmax:        168.00 cm/s MV E velocity: 72.30 cm/s MV A velocity: 59.70 cm/s  SHUNTS MV E/A ratio:  1.21        Systemic VTI:  0.12 m                            Systemic Diam: 2.20 cm Harold Hedge MD Electronically signed by Harold Hedge MD Signature Date/Time: 03/18/2020/9:49:41 AM    Final    CT HEAD CODE STROKE WO CONTRAST  Addendum Date: 03/17/2020   ADDENDUM REPORT: 03/17/2020 11:59 ADDENDUM: Code stroke imaging results were communicated on 03/17/2020 at 11:50 am to provider Dr. Erma Heritage Via telephone, who verbally acknowledged these  results. Electronically Signed   By: Stana Bunting M.D.   On: 03/17/2020 11:59   Result Date: 03/17/2020 CLINICAL DATA:  Code stroke.  Neuro deficit, acute, stroke suspected EXAM: CT HEAD WITHOUT CONTRAST TECHNIQUE: Contiguous axial images were obtained from the base of the skull through the vertex without intravenous contrast. COMPARISON:  None. FINDINGS: Brain: No acute infarct or intracranial hemorrhage. No mass lesion. No midline shift, ventriculomegaly or extra-axial fluid collection. Vascular: No hyperdense vessel or unexpected calcification. Skull: Negative for fracture or focal lesion. Sinuses/Orbits: No acute finding. Clear paranasal sinuses and mastoid air cells. Other: None. ASPECTS [facility] Stroke Program Early CT Score) - Ganglionic level infarction (caudate, lentiform nuclei, internal capsule, insula, M1-M3 cortex): 7 - Supraganglionic infarction (M4-M6 cortex): 3 Total score (0-10 with 10 being normal): 10 IMPRESSION: 1. No acute intracranial process. If  suspicion persists consider MRI brain for further evaluation. 2. ASPECTS is 10. Electronically Signed: By: Stana Bunting M.D. On: 03/17/2020 11:44   CT ANGIO HEAD CODE STROKE  Result Date: 03/17/2020 CLINICAL DATA:  Stroke/TIA, assess extracranial arteries. EXAM: CT ANGIOGRAPHY HEAD AND NECK TECHNIQUE: Multidetector CT imaging of the head and neck was performed using the standard protocol during bolus administration of intravenous contrast. Multiplanar CT image reconstructions and MIPs were obtained to evaluate the vascular anatomy. Carotid stenosis measurements (when applicable) are obtained utilizing NASCET criteria, using the distal internal carotid diameter as the denominator. CONTRAST:  75mL OMNIPAQUE IOHEXOL 350 MG/ML SOLN COMPARISON:  03/17/2020 head CT. FINDINGS: CTA NECK FINDINGS Aortic arch: Standard branching. Imaged portion shows no evidence of aneurysm or dissection. No significant stenosis of the major arch vessel origins. Right carotid system: No evidence of dissection, stenosis (50% or greater) or occlusion. Tortuous ICA cervical segment. Left carotid system: No evidence of dissection, stenosis (50% or greater) or occlusion. Vertebral arteries: Dominant left vertebral artery. No evidence of dissection, stenosis (50% or greater) or occlusion. Skeleton: No acute finding. Other neck: Enlarged bilateral level 2 nodes measuring up to 1.2 cm, likely reactive. Upper chest: Minimal upper lung atelectasis. Review of the MIP images confirms the above findings CTA HEAD FINDINGS Anterior circulation: Patent. No significant stenosis, proximal occlusion, aneurysm, or vascular malformation. Posterior circulation: Patent. No significant stenosis, proximal occlusion, aneurysm, or vascular malformation. Venous sinuses: As permitted by contrast timing, patent. Anatomic variants: None. Review of the MIP images confirms the above findings IMPRESSION: No large vessel occlusion, high-grade narrowing, dissection  or aneurysm. Mildly enlarged bilateral level 2 nodes, likely reactive. Electronically Signed   By: Stana Bunting M.D.   On: 03/17/2020 12:13   CT ANGIO NECK CODE STROKE  Result Date: 03/17/2020 CLINICAL DATA:  Stroke/TIA, assess extracranial arteries. EXAM: CT ANGIOGRAPHY HEAD AND NECK TECHNIQUE: Multidetector CT imaging of the head and neck was performed using the standard protocol during bolus administration of intravenous contrast. Multiplanar CT image reconstructions and MIPs were obtained to evaluate the vascular anatomy. Carotid stenosis measurements (when applicable) are obtained utilizing NASCET criteria, using the distal internal carotid diameter as the denominator. CONTRAST:  75mL OMNIPAQUE IOHEXOL 350 MG/ML SOLN COMPARISON:  03/17/2020 head CT. FINDINGS: CTA NECK FINDINGS Aortic arch: Standard branching. Imaged portion shows no evidence of aneurysm or dissection. No significant stenosis of the major arch vessel origins. Right carotid system: No evidence of dissection, stenosis (50% or greater) or occlusion. Tortuous ICA cervical segment. Left carotid system: No evidence of dissection, stenosis (50% or greater) or occlusion. Vertebral arteries: Dominant left vertebral artery. No evidence of dissection, stenosis (50%  or greater) or occlusion. Skeleton: No acute finding. Other neck: Enlarged bilateral level 2 nodes measuring up to 1.2 cm, likely reactive. Upper chest: Minimal upper lung atelectasis. Review of the MIP images confirms the above findings CTA HEAD FINDINGS Anterior circulation: Patent. No significant stenosis, proximal occlusion, aneurysm, or vascular malformation. Posterior circulation: Patent. No significant stenosis, proximal occlusion, aneurysm, or vascular malformation. Venous sinuses: As permitted by contrast timing, patent. Anatomic variants: None. Review of the MIP images confirms the above findings IMPRESSION: No large vessel occlusion, high-grade narrowing, dissection or  aneurysm. Mildly enlarged bilateral level 2 nodes, likely reactive. Electronically Signed   By: Stana Bunting M.D.   On: 03/17/2020 12:13     Assessment/Plan:  44 y.o. female with hx of uncontrolled HTN, not other past medical problems per patient on no medications. Last known well around 9 AM  After she woke up and started to fold laundry. Started feeling numbness RLE progressed to whole R side.  NIHSS of 1 on exam.   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 No sure if can be assisted by case management  03/18/2020, 11:55 AM

## 2020-03-18 NOTE — Progress Notes (Signed)
PT Cancellation Note  Patient Details Name: NGOC DETJEN MRN: [id number] DOB: February 11, 1976   Cancelled Treatment:    Reason Eval/Treat Not Completed: Other (comment). Consult received and chart reviewed. Per conversation with OT, no needs present at this time. Pt feels back to baseline and is independent with all mobility. Will dc in house at this time.   Amand Lemoine 03/18/2020, 9:37 AM  Elizabeth Palau, PT, DPT [id number]

## 2020-03-18 NOTE — Plan of Care (Signed)
Problem: Education: Goal: Knowledge of General Education information will improve Description: Including pain rating scale, medication(s)/side effects and non-pharmacologic comfort measures 03/18/2020 0145 by Kathi Simpers, RN Outcome: Progressing 03/17/2020 2220 by Kathi Simpers, RN Outcome: Progressing 03/17/2020 2220 by Kathi Simpers, RN Outcome: Progressing   Problem: Health Behavior/Discharge Planning: Goal: Ability to manage health-related needs will improve 03/18/2020 0145 by Kathi Simpers, RN Outcome: Progressing 03/17/2020 2220 by Kathi Simpers, RN Outcome: Progressing 03/17/2020 2220 by Kathi Simpers, RN Outcome: Progressing   Problem: Clinical Measurements: Goal: Ability to maintain clinical measurements within normal limits will improve 03/18/2020 0145 by Kathi Simpers, RN Outcome: Progressing 03/17/2020 2220 by Kathi Simpers, RN Outcome: Progressing 03/17/2020 2220 by Kathi Simpers, RN Outcome: Progressing Goal: Will remain free from infection 03/18/2020 0145 by Kathi Simpers, RN Outcome: Progressing 03/17/2020 2220 by Kathi Simpers, RN Outcome: Progressing 03/17/2020 2220 by Kathi Simpers, RN Outcome: Progressing Goal: Diagnostic test results will improve 03/18/2020 0145 by Kathi Simpers, RN Outcome: Progressing 03/17/2020 2220 by Kathi Simpers, RN Outcome: Progressing 03/17/2020 2220 by Kathi Simpers, RN Outcome: Progressing Goal: Respiratory complications will improve 03/18/2020 0145 by Kathi Simpers, RN Outcome: Progressing 03/17/2020 2220 by Kathi Simpers, RN Outcome: Progressing 03/17/2020 2220 by Kathi Simpers, RN Outcome: Progressing Goal: Cardiovascular complication will be avoided 03/18/2020 0145 by Kathi Simpers, RN Outcome: Progressing 03/17/2020 2220 by Kathi Simpers, RN Outcome: Progressing 03/17/2020 2220 by Kathi Simpers, RN Outcome: Progressing   Problem: Activity: Goal: Risk for  activity intolerance will decrease 03/18/2020 0145 by Kathi Simpers, RN Outcome: Progressing 03/17/2020 2220 by Kathi Simpers, RN Outcome: Progressing 03/17/2020 2220 by Kathi Simpers, RN Outcome: Progressing   Problem: Nutrition: Goal: Adequate nutrition will be maintained 03/18/2020 0145 by Kathi Simpers, RN Outcome: Progressing 03/17/2020 2220 by Kathi Simpers, RN Outcome: Progressing 03/17/2020 2220 by Kathi Simpers, RN Outcome: Progressing   Problem: Coping: Goal: Level of anxiety will decrease 03/18/2020 0145 by Kathi Simpers, RN Outcome: Progressing 03/17/2020 2220 by Kathi Simpers, RN Outcome: Progressing 03/17/2020 2220 by Kathi Simpers, RN Outcome: Progressing   Problem: Elimination: Goal: Will not experience complications related to bowel motility 03/18/2020 0145 by Kathi Simpers, RN Outcome: Progressing 03/17/2020 2220 by Kathi Simpers, RN Outcome: Progressing 03/17/2020 2220 by Kathi Simpers, RN Outcome: Progressing Goal: Will not experience complications related to urinary retention 03/18/2020 0145 by Kathi Simpers, RN Outcome: Progressing 03/17/2020 2220 by Kathi Simpers, RN Outcome: Progressing 03/17/2020 2220 by Kathi Simpers, RN Outcome: Progressing   Problem: Pain Managment: Goal: General experience of comfort will improve 03/18/2020 0145 by Kathi Simpers, RN Outcome: Progressing 03/17/2020 2220 by Kathi Simpers, RN Outcome: Progressing 03/17/2020 2220 by Kathi Simpers, RN Outcome: Progressing   Problem: Safety: Goal: Ability to remain free from injury will improve 03/18/2020 0145 by Kathi Simpers, RN Outcome: Progressing 03/17/2020 2220 by Kathi Simpers, RN Outcome: Progressing 03/17/2020 2220 by Kathi Simpers, RN Outcome: Progressing   Problem: Skin Integrity: Goal: Risk for impaired skin integrity will decrease 03/18/2020 0145 by Kathi Simpers, RN Outcome: Progressing 03/17/2020 2220 by  Kathi Simpers, RN Outcome: Progressing 03/17/2020 2220 by Kathi Simpers, RN Outcome: Progressing    Patient only scoring 1 on the NIH scale. Reports numbness on her whole right side that is worse in lower extremity than upper. Tylenol given for a headache that has since resolved. No new changes over night. Will continue with the current plan of care.

## 2020-03-18 NOTE — Plan of Care (Signed)
  Problem: Education: Goal: Knowledge of General Education information will improve Description: Including pain rating scale, medication(s)/side effects and non-pharmacologic comfort measures Outcome: Adequate for Discharge   Problem: Health Behavior/Discharge Planning: Goal: Ability to manage health-related needs will improve Outcome: Adequate for Discharge   Problem: Clinical Measurements: Goal: Ability to maintain clinical measurements within normal limits will improve Outcome: Adequate for Discharge Goal: Will remain free from infection Outcome: Adequate for Discharge Goal: Diagnostic test results will improve Outcome: Adequate for Discharge Goal: Respiratory complications will improve Outcome: Adequate for Discharge Goal: Cardiovascular complication will be avoided Outcome: Adequate for Discharge   Problem: Activity: Goal: Risk for activity intolerance will decrease Outcome: Adequate for Discharge   Problem: Nutrition: Goal: Adequate nutrition will be maintained Outcome: Adequate for Discharge   Problem: Coping: Goal: Level of anxiety will decrease Outcome: Adequate for Discharge   Problem: Elimination: Goal: Will not experience complications related to bowel motility Outcome: Adequate for Discharge Goal: Will not experience complications related to urinary retention Outcome: Adequate for Discharge   Problem: Pain Managment: Goal: General experience of comfort will improve Outcome: Adequate for Discharge   Problem: Safety: Goal: Ability to remain free from injury will improve Outcome: Adequate for Discharge   Problem: Skin Integrity: Goal: Risk for impaired skin integrity will decrease Outcome: Adequate for Discharge   Problem: Education: Goal: Knowledge of disease or condition will improve Outcome: Adequate for Discharge   Problem: Health Behavior/Discharge Planning: Goal: Ability to manage health-related needs will improve Outcome: Adequate for  Discharge   Problem: Education: Goal: Knowledge of secondary prevention will improve Outcome: Adequate for Discharge Goal: Knowledge of patient specific risk factors addressed and post discharge goals established will improve Outcome: Adequate for Discharge

## 2020-03-18 NOTE — Progress Notes (Signed)
SLP Cancellation Note  Patient Details Name: Cindy Manning MRN: [id number] DOB: 10/07/1975   Cancelled treatment:       Reason Eval/Treat Not Completed: SLP screened, no needs identified, will sign off   Consult received and chart reviewed. Per conversation with OT, no needs present at this time. Pt feels back to baseline and is independent with all cognitive linguistic abilities.   Lucy Boardman B. Dreama Saa M.S., CCC-SLP, [facility] Speech-Language Pathologist Rehabilitation Services Office [phone]  Reuel Derby 03/18/2020, 9:40 AM

## 2020-03-18 NOTE — Discharge Summary (Signed)
Physician Discharge Summary  Cindy Manning OZH:[id number] DOB: 10-15-1975 DOA: 03/17/2020  PCP: Patient, No Pcp Per  Admit date: 03/17/2020 Discharge date: 03/18/2020  Time spent: 35 minutes  Recommendations for Outpatient Follow-up:  1. Needs outpatient follow-up with stroke service 2. Recommend addition of amlodipine lisinopril HCTZ and recheck labs in about 1 week at clinic wherever she can be seen 3. Will need to repeat A1c, lipid panel in 1 month-added Lipitor this admission  Discharge Diagnoses:  Principal Problem:   TIA (transient ischemic attack) Active Problems:   Right leg numbness   Right arm numbness   Benign essential HTN   COPD (chronic obstructive pulmonary disease) (HCC)   Discharge Condition: Improved  Diet recommendation: Heart healthy low-salt  Filed Weights   03/17/20 1358 03/17/20 2128 03/18/20 0309  Weight: 86.2 kg 88.8 kg 89 kg    History of present illness:  44 year old white female uncontrolled HTN, untreated COPD Came [facility] 11/25 7R side weakness, R face, leg, arm numbness X 20 minutes-also difficulty keeping arm up to brush teeth, buckling legs Neurology consulted-code stroke called-not candidate for TPA 2/2 low NIHSS-stroke work-up requested MRI brain requested  Patient had the work-up performed found to have slightly elevated lipid panel, 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 Patient was started on aspirin therapy evaluation cleared the patient for discharge as an deficits were minimal-patient was instructed to start amlodipine lisinopril and HCTZ in the outpatient setting in the next several days and obtain lab work to make sure her kidney function was stable  She does not have insurance, has a sick child who is dying and still smokes and I counseled her about smoking cessation in addition which she will try to do She was stabilized for discharge on 11/26 and understands the need for follow-up TOC will assist with setting  things up for her  Discharge Exam: Vitals:   03/18/20 0309 03/18/20 0858  BP: (!) 161/102 (!) 166/116  Pulse: (!) 59 79  Resp: 17 19  Temp: 98.6 F (37 C) (!) 97.4 F (36.3 C)  SpO2: 99% 97%    General: Awake alert coherent no distress smiling but sometimes emotional Cardiovascular: S1-S2 no murmur no rub no gallop S1-S2 sinus rhythm on monitors Respiratory: Clinically clear no added sound no rales no rhonchi Abdomen soft no rebound no guarding  finger-nose-finger testing intact, power 5/5 bilaterally slight decrease in sensation in lower extremities with numbness Reflexes 5-3 Power grossly is intact  Discharge Instructions    Allergies as of 03/18/2020      Reactions   Baclofen Hives   Gabapentin Other (See Comments)   Suicidal and depression   Latex Other (See Comments)   Skin peels   Pregabalin Other (See Comments)   Suicidal and depression   Biaxin [clarithromycin] Rash   Oxycodone Itching   Low blood pressure      Medication List    TAKE these medications   amLODipine 5 MG tablet Commonly known as: NORVASC Take 1 tablet (5 mg total) by mouth daily. Start taking on: March 22, 2020   aspirin 81 MG chewable tablet Chew 1 tablet (81 mg total) by mouth daily. Start taking on: March 19, 2020   atorvastatin 40 MG tablet Commonly known as: LIPITOR Take 1 tablet (40 mg total) by mouth daily. Start taking on: March 19, 2020   buprenorphine-naloxone 8-2 mg Subl SL tablet Commonly known as: SUBOXONE Place 1 tablet under the tongue daily.   hydrochlorothiazide 25 MG tablet  Commonly known as: HYDRODIURIL Take 1 tablet (25 mg total) by mouth daily. Start taking on: March 21, 2020   lisinopril 20 MG tablet Commonly known as: ZESTRIL Take 1 tablet (20 mg total) by mouth daily. Start taking on: March 22, 2020   Sphygmomanometer Misc 1 each by Does not apply route daily.      Allergies  Allergen Reactions  . Baclofen Hives  . Gabapentin  Other (See Comments)    Suicidal and depression  . Latex Other (See Comments)    Skin peels  . Pregabalin Other (See Comments)    Suicidal and depression  . Biaxin [Clarithromycin] Rash  . Oxycodone Itching    Low blood pressure      The results of significant diagnostics from this hospitalization (including imaging, microbiology, ancillary and laboratory) are listed below for reference.    Significant Diagnostic Studies: DG Chest 2 View  Result Date: 03/17/2020 CLINICAL DATA:  CVA EXAM: CHEST - 2 VIEW COMPARISON:  None. FINDINGS: The cardiomediastinal silhouette is normal in contour. No pleural effusion. No pneumothorax. No acute pleuroparenchymal abnormality. Visualized abdomen is unremarkable. No acute osseous abnormality noted. IMPRESSION: No acute cardiopulmonary abnormality. Electronically Signed   By: Meda Klinefelter MD   On: 03/17/2020 14:58   MR BRAIN WO CONTRAST  Addendum Date: 03/18/2020   ADDENDUM REPORT: 03/18/2020 08:25 ADDENDUM: Correction, the abnormal diffusion signal is in the lateral LEFT thalamus, concordant with right side symptoms. CONCLUSION: Small acute infarction of the lateral LEFT thalamus. Electronically Signed   By: Odessa Fleming M.D.   On: 03/18/2020 08:25   Result Date: 03/18/2020 CLINICAL DATA:  Right-sided numbness lasting 20 minutes. EXAM: MRI HEAD WITHOUT CONTRAST TECHNIQUE: Multiplanar, multiecho pulse sequences of the brain and surrounding structures were obtained without intravenous contrast. COMPARISON:  CT studies same day FINDINGS: Brain: Diffusion imaging shows acute infarction in the lateral right thalamus, about 7-8 mm in size. No other acute finding. The brainstem and cerebellum are otherwise normal. Cerebral hemispheres show foci of abnormal T2 and FLAIR signal within the white matter consistent with small vessel change, markedly premature for age. No cortical or large vessel territory infarction. No mass lesion, hemorrhage, hydrocephalus or  extra-axial collection. Vascular: Major vessels at the base of the brain show flow. Skull and upper cervical spine: Negative Sinuses/Orbits: Clear/normal Other: None IMPRESSION: 1. 7-8 mm acute infarction in the lateral right thalamus. 2. Markedly premature for age chronic small-vessel ischemic changes of the cerebral hemispheric white matter. Electronically Signed: By: Paulina Fusi M.D. On: 03/17/2020 15:52   US Carotid Bilateral (at [facility] only)  Result Date: 03/18/2020 CLINICAL DATA:  Left thalamus infarct. EXAM: BILATERAL CAROTID DUPLEX ULTRASOUND TECHNIQUE: Wallace Cullens scale imaging, color Doppler and duplex ultrasound were performed of bilateral carotid and vertebral arteries in the neck. COMPARISON:  Brain MRI 03/17/2020 and CTA neck 03/17/2020 FINDINGS: Criteria: Quantification of carotid stenosis is based on velocity parameters that correlate the residual internal carotid diameter with NASCET-based stenosis levels, using the diameter of the distal internal carotid lumen as the denominator for stenosis measurement. The following velocity measurements were obtained: RIGHT ICA: 98/43 cm/sec CCA: 62/22 cm/sec SYSTOLIC ICA/CCA RATIO:  1.6 ECA: 115 cm/sec LEFT ICA: 86/30 cm/sec CCA: 71/29 cm/sec SYSTOLIC ICA/CCA RATIO:  1.2 ECA: 83 cm/sec RIGHT CAROTID ARTERY: Right carotid arteries are patent without significant plaque or stenosis. External carotid artery is patent with normal waveform. Normal waveforms and velocities in the internal carotid artery. RIGHT VERTEBRAL ARTERY: Antegrade flow and normal waveform  in the right vertebral artery. LEFT CAROTID ARTERY: Left carotid arteries are patent without significant plaque or stenosis. External carotid artery is patent with normal waveform. Normal waveforms and velocities in the internal carotid artery. LEFT VERTEBRAL ARTERY: Antegrade flow in the left vertebral artery. Waveforms not obtained. Other: Prominent right submandibular lymph node measures 1.0 cm in the  short axis. Prominent left submandibular lymph node measures 1.2 cm in the short axis. IMPRESSION: 1. Bilateral carotid arteries are widely patent without significant plaque or stenosis. 2. Patent vertebral arteries. 3. Mildly prominent lymph nodes in the submandibular regions. Findings are similar to the recent CTA findings. Electronically Signed   By: Richarda Overlie M.D.   On: 03/18/2020 08:42   ECHOCARDIOGRAM COMPLETE  Result Date: 03/18/2020    ECHOCARDIOGRAM REPORT   Patient Name:   Cindy Manning Date of Exam: 03/18/2020 Medical Rec #:  [id number]      Height:       63.0 in Accession #:    [id number]     Weight:       196.2 lb Date of Birth:  [birth year]      BSA:          1.918 m Patient Age:    44 years       BP:           161/102 mmHg Patient Gender: F              HR:           59 bpm. Exam Location:  [facility] Procedure: 2D Echo, Cardiac Doppler and Color Doppler Indications:     TIA 435.9  History:         Patient has no prior history of Echocardiogram examinations.                  Risk Factors:Hypertension.  Sonographer:     Cristela Blue RDCS (AE) Referring Phys:  [id number] Mel[address] Alar GOODRICH Diagnosing Phys: Harold Hedge MD IMPRESSIONS  1. Left ventricular ejection fraction, by estimation, is 50 to 55%. The left ventricle has low normal function. The left ventricle has no regional wall motion abnormalities. Left ventricular diastolic parameters were normal.  2. Right ventricular systolic function is normal. The right ventricular size is normal.  3. Right atrial size was mildly dilated.  4. The mitral valve was not well visualized. Trivial mitral valve regurgitation.  5. The aortic valve was not well visualized. Aortic valve regurgitation is trivial. FINDINGS  Left Ventricle: Left ventricular ejection fraction, by estimation, is 50 to 55%. The left ventricle has low normal function. The left ventricle has no regional wall motion abnormalities. The left ventricular internal cavity size was normal in size. There is  borderline left ventricular hypertrophy. Left ventricular diastolic parameters were normal. Right Ventricle: The right ventricular size is normal. No increase in right ventricular wall thickness. Right ventricular systolic function is normal. Left Atrium: Left atrial size was normal in size. Right Atrium: Right atrial size was mildly dilated. Pericardium: There is no evidence of pericardial effusion. Mitral Valve: The mitral valve was not well visualized. Trivial mitral valve regurgitation. Tricuspid Valve: The tricuspid valve is not well visualized. Tricuspid valve regurgitation is trivial. Aortic Valve: The aortic valve was not well visualized. Aortic valve regurgitation is trivial. Aortic valve mean gradient measures 2.5 mmHg. Aortic valve peak gradient measures 4.8 mmHg. Aortic valve area, by VTI measures 2.21 cm. Pulmonic Valve: The pulmonic valve was not well visualized. Pulmonic valve regurgitation is  not visualized. Aorta: The aortic root was not well visualized. IAS/Shunts: The atrial septum is grossly normal.  LEFT VENTRICLE PLAX 2D LV IVS:        0.63 cm  Diastology LVOT diam:     2.20 cm  LV e' medial:    5.11 cm/s LV SV:         46       LV E/e' medial:  14.1 LV SV Index:   24       LV e' lateral:   4.68 cm/s LVOT Area:     3.80 cm LV E/e' lateral: 15.4  RIGHT VENTRICLE RV Basal diam:  2.41 cm RV S prime:     12.80 cm/s TAPSE (M-mode): 3.4 cm LEFT ATRIUM             Index       RIGHT ATRIUM           Index LA Vol (A2C):   57.9 ml 30.19 ml/m RA Area:     18.30 cm LA Vol (A4C):   51.7 ml 26.96 ml/m RA Volume:   50.30 ml  26.23 ml/m LA Biplane Vol: 59.6 ml 31.07 ml/m  AORTIC VALVE                   PULMONIC VALVE AV Area (Vmax):    2.10 cm    PV Vmax:        0.66 m/s AV Area (Vmean):   2.06 cm    PV Peak grad:   1.8 mmHg AV Area (VTI):     2.21 cm    RVOT Peak grad: 1 mmHg AV Vmax:           109.90 cm/s AV Vmean:          67.650 cm/s AV VTI:            0.207 m AV Peak Grad:      4.8 mmHg AV Mean  Grad:      2.5 mmHg LVOT Vmax:         60.60 cm/s LVOT Vmean:        36.600 cm/s LVOT VTI:          0.120 m LVOT/AV VTI ratio: 0.58 MITRAL VALVE               TRICUSPID VALVE MV Area (PHT): 9.85 cm    TR Peak grad:   11.3 mmHg MV Decel Time: 77 msec     TR Vmax:        168.00 cm/s MV E velocity: 72.30 cm/s MV A velocity: 59.70 cm/s  SHUNTS MV E/A ratio:  1.21        Systemic VTI:  0.12 m                            Systemic Diam: 2.20 cm Harold Hedge MD Electronically signed by Harold Hedge MD Signature Date/Time: 03/18/2020/9:49:41 AM    Final    CT HEAD CODE STROKE WO CONTRAST  Addendum Date: 03/17/2020   ADDENDUM REPORT: 03/17/2020 11:59 ADDENDUM: Code stroke imaging results were communicated on 03/17/2020 at 11:50 am to provider Dr. Erma Heritage Via telephone, who verbally acknowledged these results. Electronically Signed   By: Stana Bunting M.D.   On: 03/17/2020 11:59   Result Date: 03/17/2020 CLINICAL DATA:  Code stroke.  Neuro deficit, acute, stroke suspected EXAM: CT HEAD WITHOUT CONTRAST TECHNIQUE: Contiguous axial images were obtained from the  base of the skull through the vertex without intravenous contrast. COMPARISON:  None. FINDINGS: Brain: No acute infarct or intracranial hemorrhage. No mass lesion. No midline shift, ventriculomegaly or extra-axial fluid collection. Vascular: No hyperdense vessel or unexpected calcification. Skull: Negative for fracture or focal lesion. Sinuses/Orbits: No acute finding. Clear paranasal sinuses and mastoid air cells. Other: None. ASPECTS [facility] Stroke Program Early CT Score) - Ganglionic level infarction (caudate, lentiform nuclei, internal capsule, insula, M1-M3 cortex): 7 - Supraganglionic infarction (M4-M6 cortex): 3 Total score (0-10 with 10 being normal): 10 IMPRESSION: 1. No acute intracranial process. If suspicion persists consider MRI brain for further evaluation. 2. ASPECTS is 10. Electronically Signed: By: Stana Bunting M.D. On: 03/17/2020  11:44   CT ANGIO HEAD CODE STROKE  Result Date: 03/17/2020 CLINICAL DATA:  Stroke/TIA, assess extracranial arteries. EXAM: CT ANGIOGRAPHY HEAD AND NECK TECHNIQUE: Multidetector CT imaging of the head and neck was performed using the standard protocol during bolus administration of intravenous contrast. Multiplanar CT image reconstructions and MIPs were obtained to evaluate the vascular anatomy. Carotid stenosis measurements (when applicable) are obtained utilizing NASCET criteria, using the distal internal carotid diameter as the denominator. CONTRAST:  75mL OMNIPAQUE IOHEXOL 350 MG/ML SOLN COMPARISON:  03/17/2020 head CT. FINDINGS: CTA NECK FINDINGS Aortic arch: Standard branching. Imaged portion shows no evidence of aneurysm or dissection. No significant stenosis of the major arch vessel origins. Right carotid system: No evidence of dissection, stenosis (50% or greater) or occlusion. Tortuous ICA cervical segment. Left carotid system: No evidence of dissection, stenosis (50% or greater) or occlusion. Vertebral arteries: Dominant left vertebral artery. No evidence of dissection, stenosis (50% or greater) or occlusion. Skeleton: No acute finding. Other neck: Enlarged bilateral level 2 nodes measuring up to 1.2 cm, likely reactive. Upper chest: Minimal upper lung atelectasis. Review of the MIP images confirms the above findings CTA HEAD FINDINGS Anterior circulation: Patent. No significant stenosis, proximal occlusion, aneurysm, or vascular malformation. Posterior circulation: Patent. No significant stenosis, proximal occlusion, aneurysm, or vascular malformation. Venous sinuses: As permitted by contrast timing, patent. Anatomic variants: None. Review of the MIP images confirms the above findings IMPRESSION: No large vessel occlusion, high-grade narrowing, dissection or aneurysm. Mildly enlarged bilateral level 2 nodes, likely reactive. Electronically Signed   By: Stana Bunting M.D.   On: 03/17/2020 12:13    CT ANGIO NECK CODE STROKE  Result Date: 03/17/2020 CLINICAL DATA:  Stroke/TIA, assess extracranial arteries. EXAM: CT ANGIOGRAPHY HEAD AND NECK TECHNIQUE: Multidetector CT imaging of the head and neck was performed using the standard protocol during bolus administration of intravenous contrast. Multiplanar CT image reconstructions and MIPs were obtained to evaluate the vascular anatomy. Carotid stenosis measurements (when applicable) are obtained utilizing NASCET criteria, using the distal internal carotid diameter as the denominator. CONTRAST:  75mL OMNIPAQUE IOHEXOL 350 MG/ML SOLN COMPARISON:  03/17/2020 head CT. FINDINGS: CTA NECK FINDINGS Aortic arch: Standard branching. Imaged portion shows no evidence of aneurysm or dissection. No significant stenosis of the major arch vessel origins. Right carotid system: No evidence of dissection, stenosis (50% or greater) or occlusion. Tortuous ICA cervical segment. Left carotid system: No evidence of dissection, stenosis (50% or greater) or occlusion. Vertebral arteries: Dominant left vertebral artery. No evidence of dissection, stenosis (50% or greater) or occlusion. Skeleton: No acute finding. Other neck: Enlarged bilateral level 2 nodes measuring up to 1.2 cm, likely reactive. Upper chest: Minimal upper lung atelectasis. Review of the MIP images confirms the above findings CTA HEAD FINDINGS Anterior circulation: Patent. No  significant stenosis, proximal occlusion, aneurysm, or vascular malformation. Posterior circulation: Patent. No significant stenosis, proximal occlusion, aneurysm, or vascular malformation. Venous sinuses: As permitted by contrast timing, patent. Anatomic variants: None. Review of the MIP images confirms the above findings IMPRESSION: No large vessel occlusion, high-grade narrowing, dissection or aneurysm. Mildly enlarged bilateral level 2 nodes, likely reactive. Electronically Signed   By: Stana Bunting M.D.   On: 03/17/2020 12:13     Microbiology: Recent Results (from the past 240 hour(s))  Resp Panel by RT-PCR (Flu A&B, Covid) Nasopharyngeal Swab     Status: None   Collection Time: 03/17/20  1:35 PM   Specimen: Nasopharyngeal Swab; Nasopharyngeal(NP) swabs in vial transport medium  Result Value Ref Range Status   SARS Coronavirus 2 by RT PCR NEGATIVE NEGATIVE Final    Comment: (NOTE) SARS-CoV-2 target nucleic acids are NOT DETECTED.  The SARS-CoV-2 RNA is generally detectable in upper respiratory specimens during the acute phase of infection. The lowest concentration of SARS-CoV-2 viral copies this assay can detect is 138 copies/mL. A negative result does not preclude SARS-Cov-2 infection and should not be used as the sole basis for treatment or other patient management decisions. A negative result may occur with  improper specimen collection/handling, submission of specimen other than nasopharyngeal swab, presence of viral mutation(s) within the areas targeted by this assay, and inadequate number of viral copies(<138 copies/mL). A negative result must be combined with clinical observations, patient history, and epidemiological information. The expected result is Negative.  Fact Sheet for Patients:  BloggerCourse.com  Fact Sheet for Healthcare Providers:  SeriousBroker.it  This test is no t yet approved or cleared by the [place] FDA and  has been authorized for detection and/or diagnosis of SARS-CoV-2 by FDA under an Emergency Use Authorization (EUA). This EUA will remain  in effect (meaning this test can be used) for the duration of the COVID-19 declaration under Section 564(b)(1) of the Act, 21 U.S.C.section 360bbb-3(b)(1), unless the authorization is terminated  or revoked sooner.       Influenza A by PCR NEGATIVE NEGATIVE Final   Influenza B by PCR NEGATIVE NEGATIVE Final    Comment: (NOTE) The Xpert Xpress SARS-CoV-2/FLU/RSV plus assay is  intended as an aid in the diagnosis of influenza from Nasopharyngeal swab specimens and should not be used as a sole basis for treatment. Nasal washings and aspirates are unacceptable for Xpert Xpress SARS-CoV-2/FLU/RSV testing.  Fact Sheet for Patients: BloggerCourse.com  Fact Sheet for Healthcare Providers: SeriousBroker.it  This test is not yet approved or cleared by the [place] FDA and has been authorized for detection and/or diagnosis of SARS-CoV-2 by FDA under an Emergency Use Authorization (EUA). This EUA will remain in effect (meaning this test can be used) for the duration of the COVID-19 declaration under Section 564(b)(1) of the Act, 21 U.S.C. section 360bbb-3(b)(1), unless the authorization is terminated or revoked.  Performed at [facility], [address] [place], [place] [address]      Labs: Basic Metabolic Panel: Recent Labs  Lab 03/17/20 1156  NA 138  K 3.8  CL 103  CO2 25  GLUCOSE 116*  BUN 13  CREATININE 0.75  CALCIUM 8.7*   Liver Function Tests: Recent Labs  Lab 03/17/20 1156  AST 12*  ALT 11  ALKPHOS 50  BILITOT 0.4  PROT 7.2  ALBUMIN 4.2   No results for input(s): LIPASE, AMYLASE in the last 168 hours. No results for input(s): AMMONIA in the last 168 hours. CBC:  Recent Labs  Lab 03/17/20 1156  WBC 5.2  NEUTROABS 3.0  HGB 12.4  HCT 37.2  MCV 89.6  PLT 219   Cardiac Enzymes: No results for input(s): CKTOTAL, CKMB, CKMBINDEX, TROPONINI in the last 168 hours. BNP: BNP (last 3 results) No results for input(s): BNP in the last 8760 hours.  ProBNP (last 3 results) No results for input(s): PROBNP in the last 8760 hours.  CBG: Recent Labs  Lab 03/17/20 1114  GLUCAP 123*       Signed:  Rhetta Mura MD   [facility] 03/18/2020, 11:52 AM

## 2020-03-18 NOTE — Progress Notes (Signed)
*  PRELIMINARY RESULTS* Echocardiogram 2D Echocardiogram has been performed.  Cristela Blue 03/18/2020, 8:47 AM

## 2020-03-18 NOTE — Evaluation (Signed)
Occupational Therapy Evaluation Patient Details Name: Cindy Manning MRN: [id number] DOB: 03-19-1976 Today's Date: 03/18/2020    History of Present Illness 44 year old woman PMH essential hypertension untreated, not on any medications, COPD, untreated, presented to the emergency department after developing sudden right-sided upper and lower extremity and facial numbness.  Admitted for TIA versus CVA evaluation.   Clinical Impression   Cindy Manning was seen for OT evaluation this date. Prior to hospital admission, pt was Independent in all aspects of ADL/IADL, including caregiving for 14 mo grandson. Pt lives with her spouse, Cindy Manning (works 2nd shift), and grandson in home c 4 STE and B rails. Currently pt reporting symptoms have resolved. Pt demonstrates baseline independence to perform ADL and mobility tasks and no strength, sensory, coordination, cognitive, or visual deficits appreciated with assessment. Pt instructed in home/routines modifications, stroke education, falls prevention, and ECS. No skilled OT needs identified. Will sign off. Please re-consult if additional OT needs arise.     Follow Up Recommendations  No OT follow up    Equipment Recommendations  None recommended by OT    Recommendations for Other Services       Precautions / Restrictions Precautions Precautions: None Restrictions Weight Bearing Restrictions: No      Mobility Bed Mobility Overal bed mobility: Independent       Transfers Overall transfer level: Independent      Balance Overall balance assessment: Mild deficits observed, not formally tested (furniture walks )        ADL either performed or assessed with clinical judgement   ADL Overall ADL's : Independent      General ADL Comments: Pt reports at baseline Independence for ADLs (dons shoes, simulated standing grooming tasks, and toilet t/f)      Vision Baseline Vision/History: No visual deficits Patient Visual Report: No change from  baseline              Pertinent Vitals/Pain Pain Assessment: No/denies pain     Hand Dominance Right   Extremity/Trunk Assessment Upper Extremity Assessment Upper Extremity Assessment: Overall WFL for tasks assessed (5/5 BUE. RAM and FNF intact. SILT)   Lower Extremity Assessment Lower Extremity Assessment: Overall WFL for tasks assessed       Communication Communication Communication: No difficulties   Cognition Arousal/Alertness: Awake/alert Behavior During Therapy: WFL for tasks assessed/performed Overall Cognitive Status: Within Functional Limits for tasks assessed        General Comments       Exercises Exercises: Other exercises Other Exercises Other Exercises: Pt and family educated re: OT role, d/c recs, falls prevention, ECS, stroke education, home/routines modifications Other Exercises: LBD, toilet t/f, sit<>stand, sitting/standing balance/tolerance   Shoulder Instructions      Home Living Family/patient expects to be discharged to:: Private residence Living Arrangements: Spouse/significant other;Children (husband, DiL (works second shift), 14 mo grandson) Available Help at Discharge: Family;Available PRN/intermittently Type of Home: House Home Access: Stairs to enter [employer] of Steps: 4 Entrance Stairs-Rails: Can reach both                 Home Equipment: Grab bars - tub/shower          Prior Functioning/Environment Level of Independence: Independent        Comments: Pt helps as caregiver for her 33mo grandson (son ill in hospital, DiL works 2nd shift)        OT Problem List: Impaired sensation      OT Treatment/Interventions:      OT Goals(Current goals  can be found in the care plan section) Acute Rehab OT Goals Patient Stated Goal: To return to caregiving for grandson  OT Goal Formulation: With patient/family Time For Goal Achievement: 04/01/20 Potential to Achieve Goals: Good      AM-PAC OT "6 Clicks" Daily  Activity     Outcome Measure Help from another person eating meals?: None Help from another person taking care of personal grooming?: None Help from another person toileting, which includes using toliet, bedpan, or urinal?: A Little Help from another person bathing (including washing, rinsing, drying)?: A Little Help from another person to put on and taking off regular upper body clothing?: None Help from another person to put on and taking off regular lower body clothing?: None 6 Click Score: 22   End of Session    Activity Tolerance: Patient tolerated treatment well Patient left: with family/visitor present (pt in bathroom to toilet, husband in room )  OT Visit Diagnosis: Other abnormalities of gait and mobility (R26.89)                Time: 9798-9211 OT Time Calculation (min): 14 min Charges:  OT General Charges $OT Visit: 1 Visit OT Evaluation $OT Eval Low Complexity: 1 Low OT Treatments $Self Care/Home Management : 8-22 mins  Kathie Dike, M.S. OTR/L  03/18/20, 9:40 AM  ascom [phone]

## 2021-05-22 ENCOUNTER — Other Ambulatory Visit: Payer: Self-pay

## 2021-05-22 ENCOUNTER — Emergency Department
Admission: EM | Admit: 2021-05-22 | Discharge: 2021-05-22 | Disposition: A | Discharge status: Home / Self Care | Payer: Medicaid Other | Attending: Emergency Medicine | Admitting: Emergency Medicine

## 2021-05-22 DIAGNOSIS — R21 Rash and other nonspecific skin eruption: Secondary | ICD-10-CM | POA: Insufficient documentation

## 2021-05-22 DIAGNOSIS — I10 Essential (primary) hypertension: Secondary | ICD-10-CM | POA: Insufficient documentation

## 2021-05-22 DIAGNOSIS — M7989 Other specified soft tissue disorders: Secondary | ICD-10-CM | POA: Insufficient documentation

## 2021-05-22 DIAGNOSIS — J449 Chronic obstructive pulmonary disease, unspecified: Secondary | ICD-10-CM | POA: Insufficient documentation

## 2021-05-22 LAB — COMPREHENSIVE METABOLIC PANEL
ALT: 10 U/L (ref 0–44)
AST: 10 U/L — ABNORMAL LOW (ref 15–41)
Albumin: 3.8 g/dL (ref 3.5–5.0)
Alkaline Phosphatase: 59 U/L (ref 38–126)
Anion gap: 8 (ref 5–15)
BUN: 15 mg/dL (ref 6–20)
CO2: 27 mmol/L (ref 22–32)
Calcium: 8.9 mg/dL (ref 8.9–10.3)
Chloride: 101 mmol/L (ref 98–111)
Creatinine, Ser: 0.84 mg/dL (ref 0.44–1.00)
GFR, Estimated: >60 mL/min (ref >60)
Glucose, Bld: 104 mg/dL — ABNORMAL HIGH (ref 70–99)
Potassium: 4.1 mmol/L (ref 3.5–5.1)
Sodium: 136 mmol/L (ref 135–145)
Total Bilirubin: 0.4 mg/dL (ref 0.3–1.2)
Total Protein: 6.7 g/dL (ref 6.5–8.1)

## 2021-05-22 LAB — CBC WITH DIFFERENTIAL/PLATELET
Abs Immature Granulocytes: 0.01 10*3/uL (ref 0.00–0.07)
Basophils Absolute: 0.1 10*3/uL (ref 0.0–0.1)
Basophils Relative: 1 %
Eosinophils Absolute: 0.5 10*3/uL (ref 0.0–0.5)
Eosinophils Relative: 7 %
HCT: 35.3 % — ABNORMAL LOW (ref 36.0–46.0)
Hemoglobin: 11.1 g/dL — ABNORMAL LOW (ref 12.0–15.0)
Immature Granulocytes: 0 %
Lymphocytes Relative: 30 %
Lymphs Abs: 2 10*3/uL (ref 0.7–4.0)
MCH: 27.4 pg (ref 26.0–34.0)
MCHC: 31.4 g/dL (ref 30.0–36.0)
MCV: 87.2 fL (ref 80.0–100.0)
Monocytes Absolute: 0.6 10*3/uL (ref 0.1–1.0)
Monocytes Relative: 10 %
Neutro Abs: 3.4 10*3/uL (ref 1.7–7.7)
Neutrophils Relative %: 52 %
Platelets: 220 10*3/uL (ref 150–400)
RBC: 4.05 MIL/uL (ref 3.87–5.11)
RDW: 12.9 % (ref 11.5–15.5)
WBC: 6.5 10*3/uL (ref 4.0–10.5)
nRBC: 0 % (ref 0.0–0.2)

## 2021-05-22 MED ORDER — DIPHENHYDRAMINE HCL 25 MG PO CAPS
50.0000 mg | ORAL_CAPSULE | Freq: Once | ORAL | Status: AC
Start: 2021-05-22 — End: 2021-05-22
  Administered 2021-05-22: 50 mg via ORAL
  Filled 2021-05-22: qty 2

## 2021-05-22 MED ORDER — TRIAMCINOLONE ACETONIDE 0.1 % EX CREA: 1.0000 "application " | TOPICAL_CREAM | Freq: Three times a day (TID) | CUTANEOUS | 0 refills | Status: DC

## 2021-05-22 MED ORDER — CALAMINE EX LOTN
TOPICAL_LOTION | Freq: Once | CUTANEOUS | Status: DC
  Filled 2021-05-22: qty 177

## 2021-05-22 MED ORDER — TRIAMCINOLONE ACETONIDE 0.1 % EX CREA
1.0000 | TOPICAL_CREAM | Freq: Three times a day (TID) | CUTANEOUS | 0 refills | Status: AC
Start: 2021-05-22 — End: 2021-06-05

## 2021-05-22 MED ORDER — DOXYCYCLINE HYCLATE 100 MG PO TABS: 100.0000 mg | ORAL_TABLET | Freq: Two times a day (BID) | ORAL | 0 refills | Status: AC

## 2021-05-22 MED ORDER — FLUCONAZOLE 100 MG PO TABS: 100.0000 mg | ORAL_TABLET | Freq: Once | ORAL | 0 refills | Status: DC

## 2021-05-22 MED ORDER — DOXYCYCLINE HYCLATE 100 MG PO TABS: 100.0000 mg | ORAL_TABLET | Freq: Two times a day (BID) | ORAL | 0 refills | Status: DC

## 2021-05-22 MED ORDER — FLUCONAZOLE 100 MG PO TABS: 100.0000 mg | ORAL_TABLET | Freq: Once | ORAL | 0 refills | Status: AC

## 2021-05-22 NOTE — ED Provider Notes (Signed)
[facility] Provider Note    Event Date/Time   First MD Initiated Contact with Patient 05/22/21 1149     (approximate)   History   Chief Complaint Rash and Leg Swelling   HPI Cindy Manning is a 46 y.o. female, history of TIA, hypertension, COPD, and factor V deficiency, presents to the emergency department for evaluation of rash and bilateral leg swelling.  Patient states that she has been having increased leg swelling over the past 6 months in both lower extremities.  This began after her TIA last June.  However, over the past few weeks she has developed a rash in her lower extremities that she describes as very itchy.  It has been gradually worsening.  She states that she was recently evaluated for this at [facility], where they ruled out blood clots but did not address her rash.  Denies fever/chills, chest pain, shortness of breath, abdominal pain,   Per external records review, patient was evaluated at [facility] emergency department on 05/03/2021 for bilateral leg swelling.  She reportedly had a positive D-dimer, prompting bilateral ultrasounds and a CTA of her chest.  Both were negative.  Patient was discharged with antihypertensives.  History Limitations: No limitations.      Physical Exam  Triage Vital Signs: ED Triage Vitals [05/22/21 1136]  Enc Vitals Group     BP (!) 180/109     Pulse Rate 88     Resp 20     Temp 97.8 F (36.6 C)     Temp Source Oral     SpO2 100 %     Weight      Height      Head Circumference      Peak Flow      Pain Score      Pain Loc      Pain Edu?      Excl. in [place]?     Most recent vital signs: Vitals:   05/22/21 1136  BP: (!) 180/109  Pulse: 88  Resp: 20  Temp: 97.8 F (36.6 C)  SpO2: 100%    General: Awake, NAD.  CV: Good peripheral perfusion.  Resp: Normal effort.  Abd: Soft, non-tender. No distention.  Neuro: At baseline.  No gross focal deficits. Other: Erythematous rash appreciated in the lower  extremities, below the knee.  There are small pustular lesions scattered as well.  Her lower extremities are not remarkably warm or tender to the touch.  Pulse, motor, sensation intact distally  Physical Exam    ED Results / Procedures / Treatments  Labs (all labs ordered are listed, but only abnormal results are displayed) Labs Reviewed  COMPREHENSIVE METABOLIC PANEL - Abnormal; Notable for the following components:      Result Value   Glucose, Bld 104 (*)    AST 10 (*)    All other components within normal limits  CBC WITH DIFFERENTIAL/PLATELET - Abnormal; Notable for the following components:   Hemoglobin 11.1 (*)    HCT 35.3 (*)    All other components within normal limits     EKG Not applicable.   RADIOLOGY  ED Provider Interpretation: None.  No results found.  PROCEDURES:  Critical Care performed: None.  Procedures    MEDICATIONS ORDERED IN ED: Medications  calamine lotion ( Topical Not Given 05/22/21 1255)  diphenhydrAMINE (BENADRYL) capsule 50 mg (50 mg Oral Given 05/22/21 1314)     IMPRESSION / MDM / ASSESSMENT AND PLAN / ED COURSE  I reviewed the triage vital signs and the nursing notes.                              Cindy Manning is a 46 y.o. female, history of TIA, hypertension, COPD, and factor V deficiency, presents to the emergency department for evaluation of rash and bilateral leg swelling.  Patient states that she has been having increased leg swelling over the past 6 months in both lower extremities.  This began after her TIA last June.  However, over the past few weeks she has developed a rash in her lower extremities that she describes as very itchy.  Differential diagnosis includes, but is not limited to, cellulitis, folliculitis, DVT, contact dermatitis, allergic dermatitis  ED Course Patient appears well.  She is hypertensive at 180/109, though she is asymptomatic at this time.  She is afebrile.    We will go ahead and treat with  Benadryl.  Pending CBC/CMP.  CBC unremarkable for leukocytosis or clinically significant anemia.  CMP shows no electrolyte abnormalities or evidence of kidney injury  Assessment/Plan Given the patient's history, physical exam, and work-up thus far, I do not suspect any serious or life-threatening pathology.  Low suspicion for DVT given lack of calf pain/claudication and negative ultrasounds last week.  Leg swelling unlikely due to CHF, she has had multiple echocardiograms in the past few months which have been negative.  Rash is erythematous and appears like a allergic versus contact dermatitis, though cannot definitively rule out infectious etiologies.  We will go ahead and discharge this patient with a prescription for doxycycline and topical corticosteroid.  Patient states that she often gets vaginal yeast infection when she takes antibiotics and is requesting fluconazole as well.  We will provide.  Advised the patient to follow-up with her primary care provider as needed.   Patient was provided with anticipatory guidance, return precautions, and educational material. Encouraged the patient to return to the emergency department at any time if they begin to experience any new or worsening symptoms.       FINAL CLINICAL IMPRESSION(S) / ED DIAGNOSES   Final diagnoses:  Rash     Rx / DC Orders   ED Discharge Orders          Ordered    doxycycline (VIBRA-TABS) 100 MG tablet  2 times daily        05/22/21 1355    triamcinolone cream (KENALOG) 0.1 %  3 times daily        05/22/21 1355    fluconazole (DIFLUCAN) 100 MG tablet   Once        05/22/21 1355             Note:  This document was prepared using Dragon voice recognition software and may include unintentional dictation errors.   Varney Daily, [place] 05/22/21 1358    Georga Hacking, MD 05/22/21 [phone]

## 2021-05-22 NOTE — ED Triage Notes (Signed)
Pt c/o an itchy rash and swelling to BL LE

## 2021-05-22 NOTE — ED Notes (Addendum)
See triage note  presents with rash and swelling to BLE  positive itching  states she has used Calamine lotion w/o relief  3+ edema noted to both lower extremities

## 2021-05-22 NOTE — Discharge Instructions (Addendum)
-  Take the full course of your medications, as prescribed. -Return to the emergency department anytime if you begin to experience any new or worsening symptoms. -Establish with a primary care provider as soon as you can possible

## 2021-06-02 IMAGING — US US CAROTID DUPLEX BILAT
1 series · 13 of 24 positions shown · non-contrast
Comparison: Brain MRI 03/17/2020 and CTA neck 03/17/2020

CLINICAL DATA: Left thalamus infarct.

EXAM:
BILATERAL CAROTID DUPLEX ULTRASOUND
TECHNIQUE: Gray scale imaging, color Doppler and duplex ultrasound were
performed of bilateral carotid and vertebral arteries in the neck.

[Series 1: us carotid bilateral · 13 of 75 slices shown]
[im 1/75]
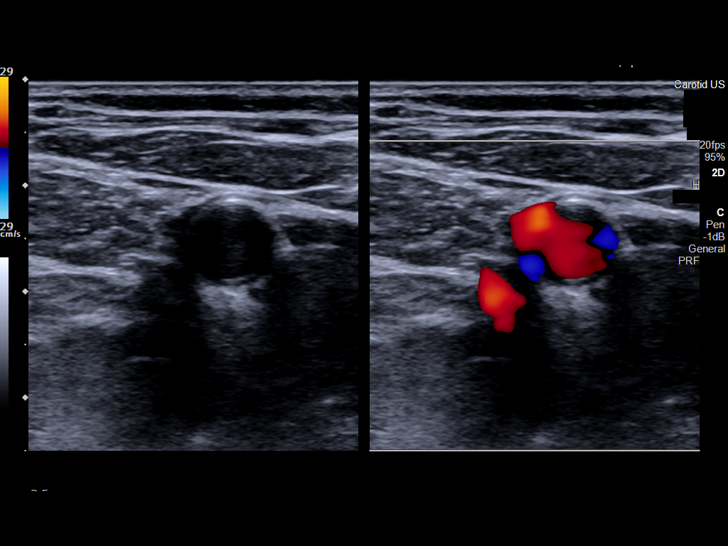
[im 7/75]
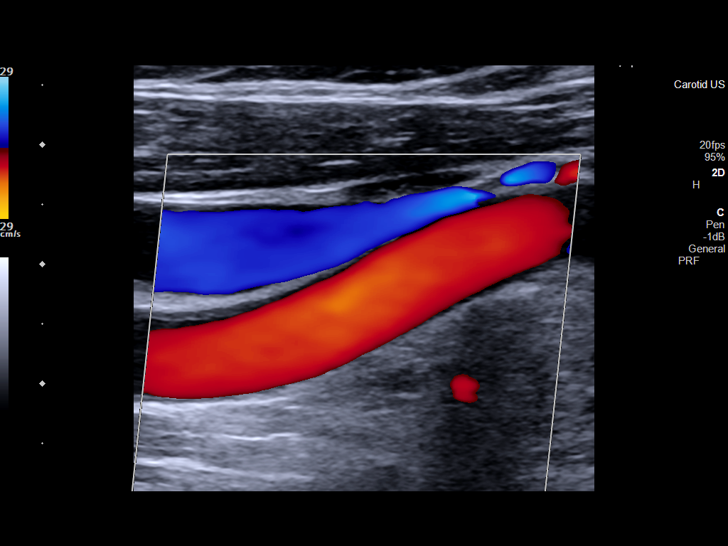
[im 13/75]
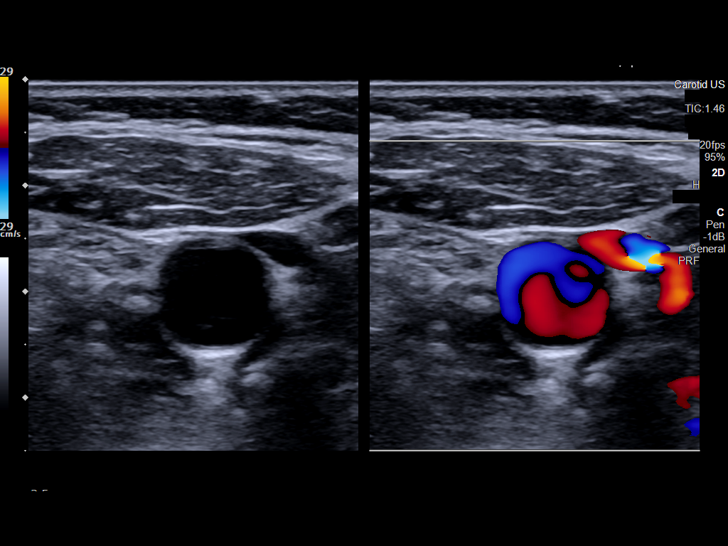
[im 20/75]
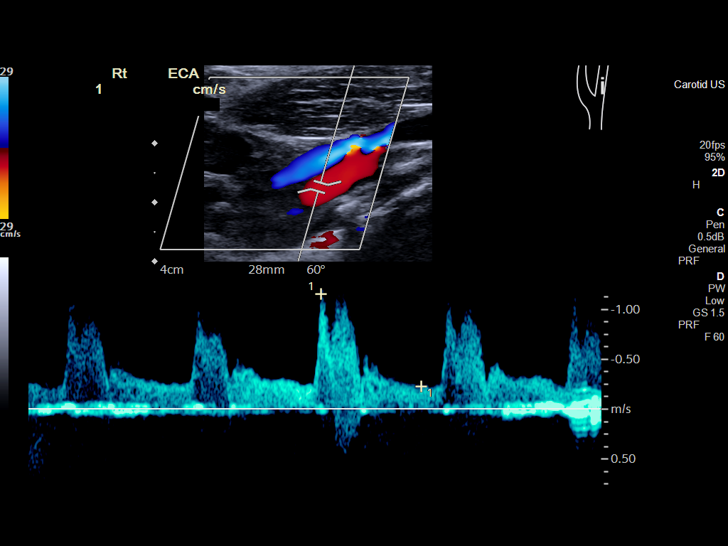
[im 26/75]
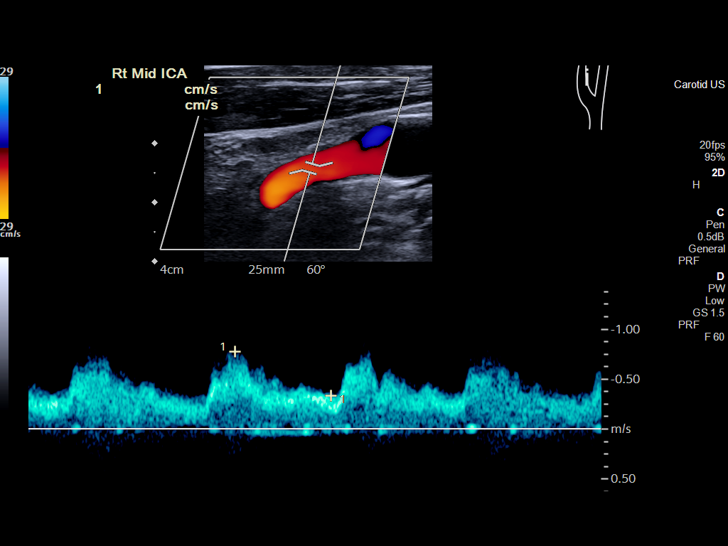
[im 33/75]
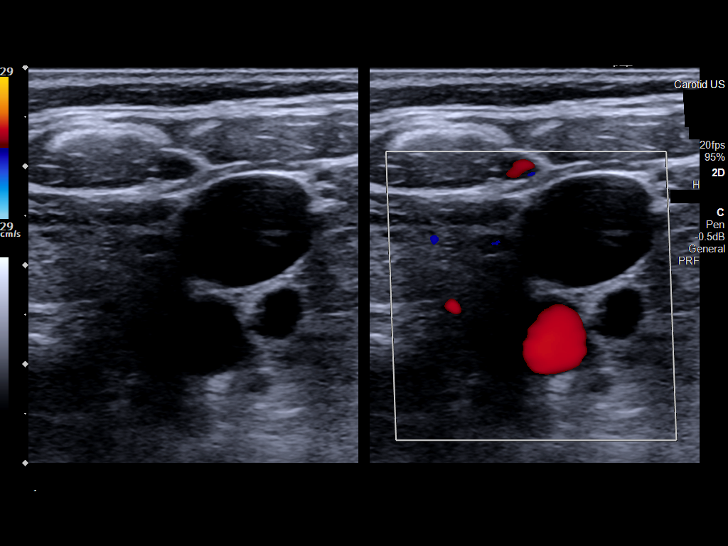
[im 39/75]
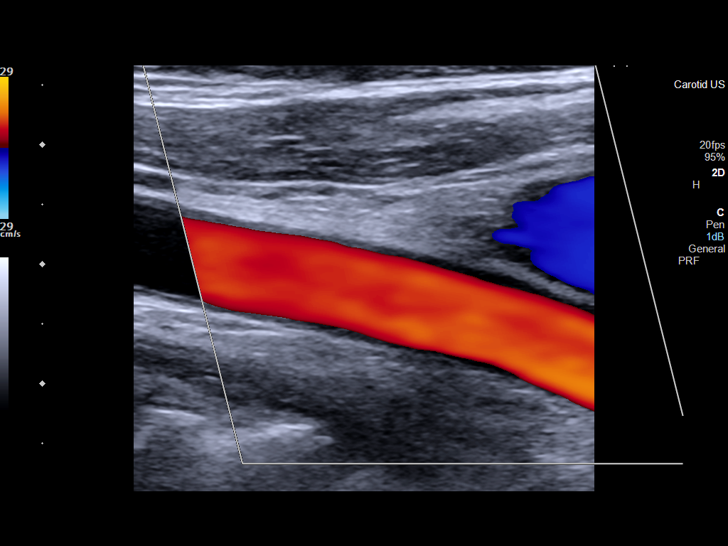
[im 42/75]
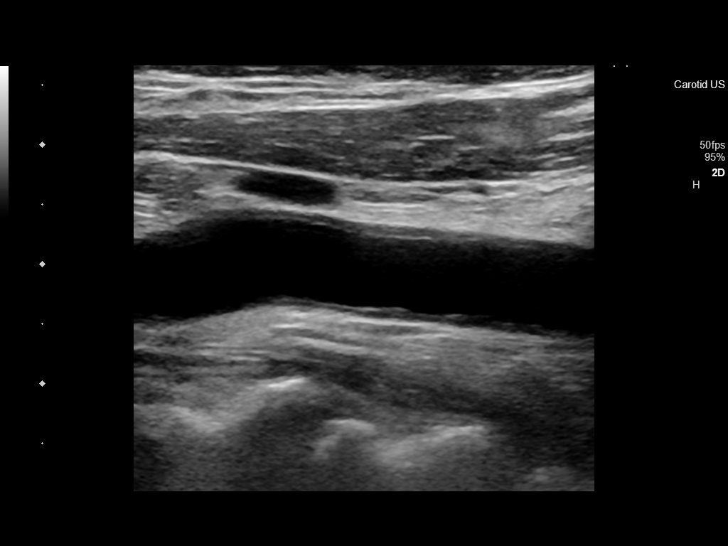
[im 49/75]
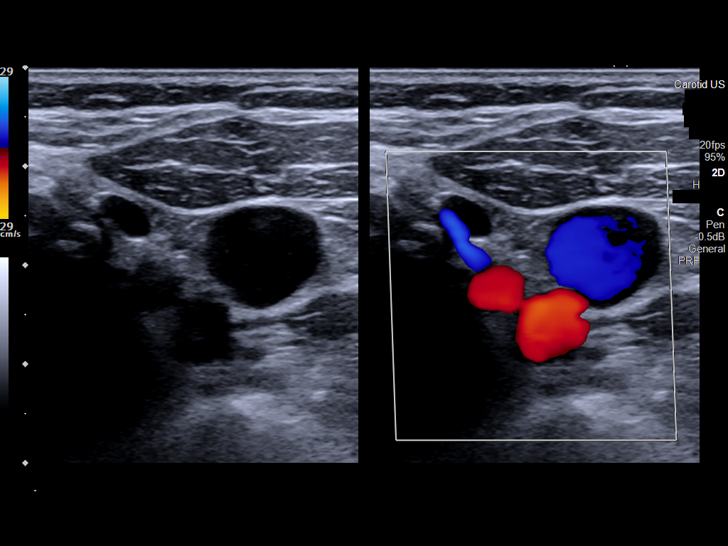
[im 55/75]
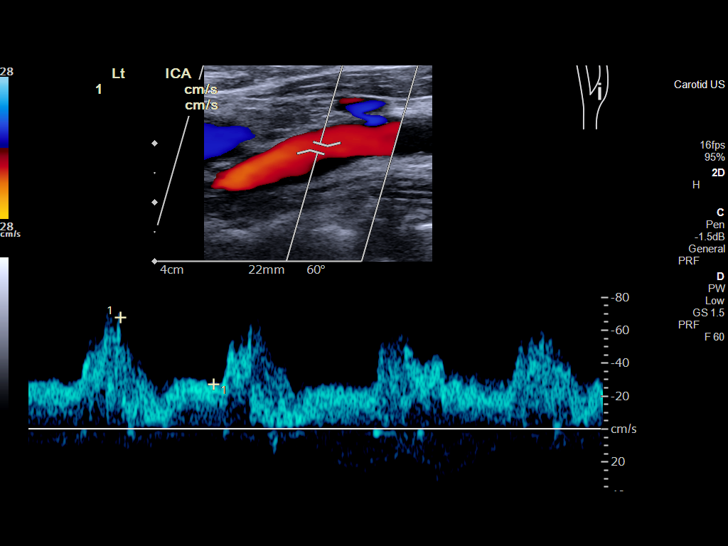
[im 62/75]
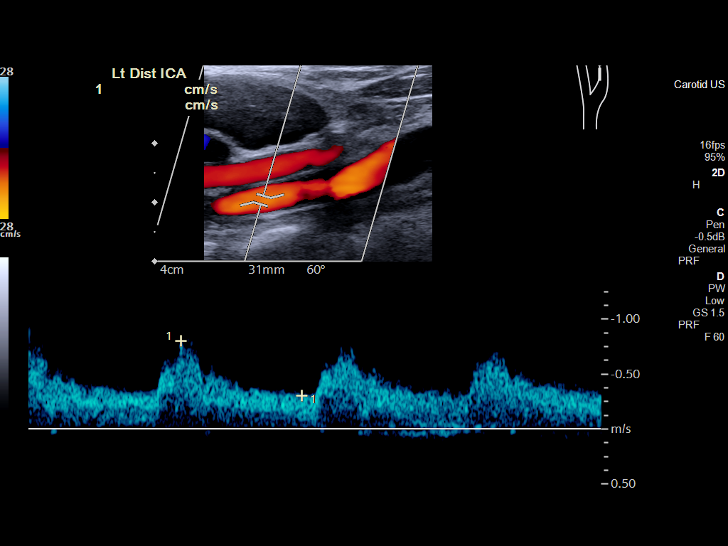
[im 68/75]
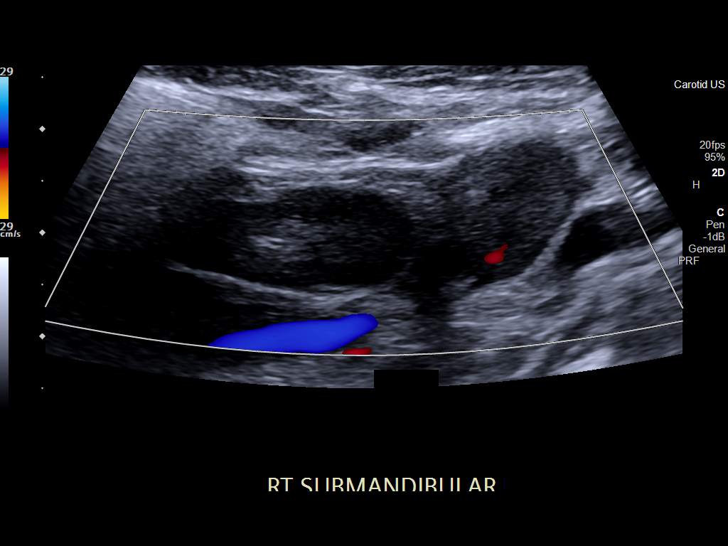
[im 75/75]
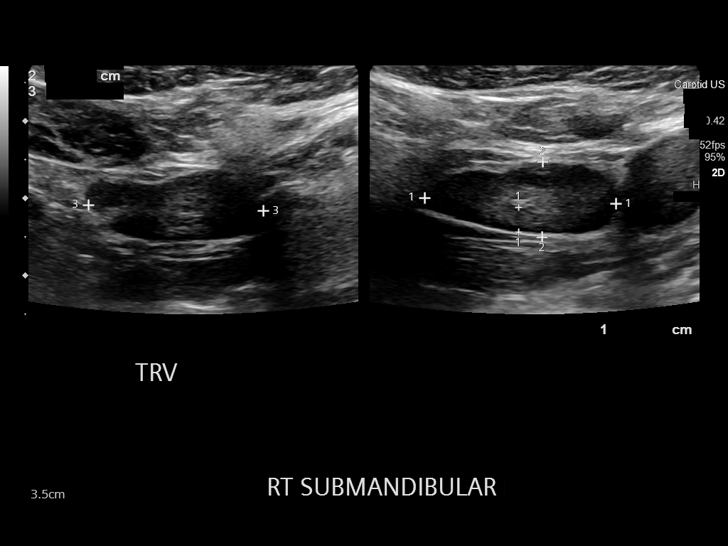

[13 of 24 positions shown; findings below may reference images not displayed]

FINDINGS: Criteria: Quantification of carotid stenosis is based on velocity
parameters that correlate the residual internal carotid diameter
with NASCET-based stenosis levels, using the diameter of the distal
internal carotid lumen as the denominator for stenosis measurement.

The following velocity measurements were obtained:

RIGHT

ICA: 98/43 cm/sec

CCA: 62/22 cm/sec

SYSTOLIC ICA/CCA RATIO:

ECA: 115 cm/sec

LEFT

ICA: 86/30 cm/sec

CCA: 71/29 cm/sec

SYSTOLIC ICA/CCA RATIO:

ECA: 83 cm/sec

RIGHT CAROTID ARTERY: Right carotid arteries are patent without
significant plaque or stenosis. External carotid artery is patent
with normal waveform. Normal waveforms and velocities in the
internal carotid artery.

RIGHT VERTEBRAL ARTERY: Antegrade flow and normal waveform in the
right vertebral artery.

LEFT CAROTID ARTERY: Left carotid arteries are patent without
significant plaque or stenosis. External carotid artery is patent
with normal waveform. Normal waveforms and velocities in the
internal carotid artery.

LEFT VERTEBRAL ARTERY: Antegrade flow in the left vertebral artery.
Waveforms not obtained.

Other: Prominent right submandibular lymph node measures 1.0 cm in
the short axis. Prominent left submandibular lymph node measures
cm in the short axis.
IMPRESSION: 1. Bilateral carotid arteries are widely patent without significant
plaque or stenosis.
2. Patent vertebral arteries.
3. Mildly prominent lymph nodes in the submandibular regions.
Findings are similar to the recent CTA findings.

## 2022-01-26 ENCOUNTER — Emergency Department
Admission: EM | Admit: 2022-01-26 | Discharge: 2022-01-26 | Disposition: A | Discharge status: Home / Self Care | Payer: Medicaid Other | Attending: Emergency Medicine | Admitting: Emergency Medicine

## 2022-01-26 ENCOUNTER — Encounter: Payer: Self-pay | Admitting: Emergency Medicine

## 2022-01-26 ENCOUNTER — Emergency Department: Payer: Medicaid Other

## 2022-01-26 ENCOUNTER — Other Ambulatory Visit: Payer: Self-pay

## 2022-01-26 DIAGNOSIS — I10 Essential (primary) hypertension: Secondary | ICD-10-CM | POA: Insufficient documentation

## 2022-01-26 DIAGNOSIS — W109XXA Fall (on) (from) unspecified stairs and steps, initial encounter: Secondary | ICD-10-CM | POA: Insufficient documentation

## 2022-01-26 DIAGNOSIS — W19XXXA Unspecified fall, initial encounter: Secondary | ICD-10-CM

## 2022-01-26 DIAGNOSIS — S60222A Contusion of left hand, initial encounter: Secondary | ICD-10-CM | POA: Insufficient documentation

## 2022-01-26 NOTE — Discharge Instructions (Addendum)
Your x-ray of your wrist was negative but we are going to place it in an immobilizer to ensure that there is not a fracture that is being missed.  You should follow-up with orthopedics for repeat x-ray in 2 weeks and leave your wrist in the immobilizer until then.  You should return to the ER if you develop weakness dizziness or any other concerns.  We have opted to hold off on any blood work or CT imaging per your preference.  Your blood pressure was elevated today and this will need to be rechecked with a primary care doctor.

## 2022-01-26 NOTE — ED Provider Notes (Signed)
[facility] Provider Note    Event Date/Time   First MD Initiated Contact with Patient 01/26/22 1041     (approximate)   History   Fall   HPI  Cindy Manning is a 46 y.o. female with hypertension, prior stroke on aspirin who comes in with concerns for fall.  Patient reports having a mechanical fall when she was walking down the stairs after her dog where she thinks that she slipped and fell and went forward and hit the front of the right side of her face and braced herself with her left hand.  She reports pain on her left hand with increasing bruising.  She reports the fall happened on Tuesday.  She denies any LOC, chest pain, shortness of breath, dizziness, abdominal pain or any other concerns--she just want to make sure that there was not a broken bone on her hand.  Physical Exam   Triage Vital Signs: ED Triage Vitals  Enc Vitals Group     BP 01/26/22 1016 (!) 180/139     Pulse Rate 01/26/22 1016 (!) 108     Resp --      Temp 01/26/22 1016 97.8 F (36.6 C)     Temp Source 01/26/22 1016 Oral     SpO2 01/26/22 1016 99 %     Weight 01/26/22 1059 196 lb 3.4 oz (89 kg)     Height 01/26/22 1059 5\' 3"  (1.6 m)     Head Circumference --      Peak Flow --      Pain Score 01/26/22 1027 6     Pain Loc --      Pain Edu? --      Excl. in [place]? --     Most recent vital signs: Vitals:   01/26/22 1016  BP: (!) 180/139  Pulse: (!) 108  Temp: 97.8 F (36.6 C)  SpO2: 99%     General: Awake, no distress.  CV:  Good peripheral perfusion.  Resp:  Normal effort.  Abd:  No distention.  Other:  Patient has a little bit of bruising underneath the right eye pupils reactive extraocular movements are intact no significant tenderness.  She is got no C-spine tenderness full range of motion of neck.  She is got some tenderness on the left scaphoid area with bruising noted on the dorsum of the left hand but range of motion is intact with good distal pulse.  No chest wall  tenderness abdominal tenderness.  She has no evidence of raccoon eyes, battle sign or hemotympanum on examination   ED Results / Procedures / Treatments   Labs (all labs ordered are listed, but only abnormal results are displayed) Labs Reviewed - No data to display I reviewed patient's blood work from [facility] on 01/22/2022 where patient had normal CBC.  Normal creatinine on CMP  EKG  My interpretation of EKG:  Normal sinus rhythm 98 without any ST elevation or T wave inversions, normal intervals  RADIOLOGY I have reviewed the xray personally and interpreted no evidence of any fracture  PROCEDURES:  Critical Care performed: No  Procedures   MEDICATIONS ORDERED IN ED: Medications - No data to display   IMPRESSION / MDM / [place] / ED COURSE  I reviewed the triage vital signs and the nursing notes.   Patient's presentation is most consistent with acute presentation with potential threat to life or bodily function.   I reviewed patient's note from the emergency room in [facility]  on 01/22/2022 as well as patient admission in November 2021 where patient was admitted for stroke-  Patient comes in with concerns for a fall-we discussed CT imaging of the head and face to evaluate for any evidence of intercranial hemorrhage versus facial fracture but patient reports feeling at her normal self denies any blood thinners and declines CT imaging and understands a small risk of missing something like this and reports that she will return if she develops any issues.  X-ray of the hand was ordered without any evidence of fracture.  I rechecked patient's heart rate and it was 98.  Discussed with patient her elevated blood pressure and she was just recently started on blood pressure medicine and reports that is actually down from when she was seen a few days ago and she will continue to monitor this with her primary care doctor.  We discussed repeating blood work today but given she had no  syncopal or dizziness spells she would like to hold off on blood work as well.   Given patient does have some pain over the scaphoid area patient was placed in wrist immobilizer and given orthopedic number for follow-up.  She expressed understanding and felt comfortable with discharge    FINAL CLINICAL IMPRESSION(S) / ED DIAGNOSES   Final diagnoses:  Fall, initial encounter  Contusion of left hand, initial encounter  Hypertension, unspecified type     Rx / DC Orders   ED Discharge Orders     None        Note:  This document was prepared using Dragon voice recognition software and may include unintentional dictation errors.   Concha Se, MD 01/26/22 1143

## 2022-01-26 NOTE — ED Triage Notes (Signed)
Pt presents via POV with c/o fall on Tuesday. Pt reports hitting face & left hand with fall. Reports ongoing pain to left hand. Pt reports recently started new medication and got up too fast and fell. Pt reports taking suboxone, hydroxyzine, & amlodipine & lasix Pt alert and oriented x4- denies LOC with fall. Pt denies being on blood thinners.

## 2023-05-23 ENCOUNTER — Emergency Department: Admission: EM | Admit: 2023-05-23 | Discharge: 2023-05-23 | Discharge status: Against Medical Advice | Payer: MEDICAID

## 2023-05-23 NOTE — ED Notes (Signed)
No answer when called several times from lobby
